# Patient Record
Sex: Male | Born: 1960
Health system: Southern US, Community
[De-identification: ages and names within clinical notes are randomized; demographics above are authoritative.]

## PROBLEM LIST (undated history)

## (undated) DIAGNOSIS — J449 Chronic obstructive pulmonary disease, unspecified: Secondary | ICD-10-CM

## (undated) DIAGNOSIS — M109 Gout, unspecified: Secondary | ICD-10-CM

## (undated) DIAGNOSIS — E785 Hyperlipidemia, unspecified: Secondary | ICD-10-CM

## (undated) DIAGNOSIS — I10 Essential (primary) hypertension: Secondary | ICD-10-CM

## (undated) DIAGNOSIS — K219 Gastro-esophageal reflux disease without esophagitis: Secondary | ICD-10-CM

## (undated) HISTORY — DX: Chronic obstructive pulmonary disease, unspecified: J44.9

## (undated) HISTORY — DX: Essential (primary) hypertension: I10

## (undated) HISTORY — DX: Gastro-esophageal reflux disease without esophagitis: K21.9

## (undated) HISTORY — DX: Hyperlipidemia, unspecified: E78.5

## (undated) HISTORY — PX: UPPER GASTROINTESTINAL ENDOSCOPY: SHX188

## (undated) HISTORY — DX: Gout, unspecified: M10.9

## (undated) HISTORY — PX: TONSILLECTOMY: SUR1361

---

## 2004-08-07 ENCOUNTER — Ambulatory Visit: Payer: Self-pay | Admitting: Cardiology

## 2005-11-01 ENCOUNTER — Emergency Department (HOSPITAL_COMMUNITY): Admission: EM | Admit: 2005-11-01 | Discharge: 2005-11-01 | Payer: Self-pay | Admitting: Family Medicine

## 2010-02-06 ENCOUNTER — Encounter: Payer: Self-pay | Admitting: Internal Medicine

## 2010-02-09 ENCOUNTER — Encounter (INDEPENDENT_AMBULATORY_CARE_PROVIDER_SITE_OTHER): Payer: Self-pay | Admitting: *Deleted

## 2010-02-09 DIAGNOSIS — I10 Essential (primary) hypertension: Secondary | ICD-10-CM | POA: Insufficient documentation

## 2010-02-09 DIAGNOSIS — R1013 Epigastric pain: Secondary | ICD-10-CM | POA: Insufficient documentation

## 2010-02-09 DIAGNOSIS — E785 Hyperlipidemia, unspecified: Secondary | ICD-10-CM | POA: Insufficient documentation

## 2010-02-09 DIAGNOSIS — J45909 Unspecified asthma, uncomplicated: Secondary | ICD-10-CM | POA: Insufficient documentation

## 2010-02-13 ENCOUNTER — Ambulatory Visit: Payer: Self-pay | Admitting: Internal Medicine

## 2010-02-13 DIAGNOSIS — J4489 Other specified chronic obstructive pulmonary disease: Secondary | ICD-10-CM | POA: Insufficient documentation

## 2010-02-13 DIAGNOSIS — J449 Chronic obstructive pulmonary disease, unspecified: Secondary | ICD-10-CM | POA: Insufficient documentation

## 2010-02-22 ENCOUNTER — Ambulatory Visit: Payer: Self-pay | Admitting: Internal Medicine

## 2010-02-24 ENCOUNTER — Encounter: Payer: Self-pay | Admitting: Internal Medicine

## 2010-06-13 ENCOUNTER — Encounter: Admission: RE | Admit: 2010-06-13 | Discharge: 2010-06-13 | Payer: Self-pay | Admitting: Occupational Medicine

## 2010-09-25 NOTE — Letter (Signed)
Summary: EGD Instructions  [facility]  [address] [place], [place] [address]   Phone: [phone]  Fax: [phone]       Austin Mcbride    July 07, 1961    MRN: [id number]       Procedure Day /Date: Thursday 02/22/10     Arrival Time: 7:30 am     Procedure Time: 8:00 am     Location of Procedure:                    _ x _ [facility] (4th Floor)  PREPARATION FOR ENDOSCOPY   On 02/22/10 THE DAY OF THE PROCEDURE:  1.   No solid foods, milk or milk products are allowed after midnight the night before your procedure.  2.   Do not drink anything colored red or purple.  Avoid juices with pulp.  No orange juice.  3.  You may drink clear liquids until 6:00 am, which is 2 hours before your procedure.                                                                                                CLEAR LIQUIDS INCLUDE: Water Jello Ice Popsicles Tea (sugar ok, no milk/cream) Powdered fruit flavored drinks Coffee (sugar ok, no milk/cream) Gatorade Juice: apple, white grape, white cranberry  Lemonade Clear bullion, consomm, broth Carbonated beverages (any kind) Strained chicken noodle soup Hard Candy   MEDICATION INSTRUCTIONS  Unless otherwise instructed, you should take regular prescription medications with a small sip of water as early as possible the morning of your procedure.                   OTHER INSTRUCTIONS  You will need a responsible adult at least 51 years of age to accompany you and drive you home.   This person must remain in the waiting room during your procedure.  Wear loose fitting clothing that is easily removed.  Leave jewelry and other valuables at home.  However, you may wish to bring a book to read or an iPod/MP3 player to listen to music as you wait for your procedure to start.  Remove all body piercing jewelry and leave at home.  Total time from sign-in until discharge is approximately 2-3 hours.  You should go  home directly after your procedure and rest.  You can resume normal activities the day after your procedure.  The day of your procedure you should not:   Drive   Make legal decisions   Operate machinery   Drink alcohol   Return to work  You will receive specific instructions about eating, activities and medications before you leave.    The above instructions have been reviewed and explained to me by   Lamona Curl CMA Duncan Dull)  February 13, 2010 9:26 AM     I fully understand and can verbalize these instructions _____________________________ Date 02/13/10

## 2010-09-25 NOTE — Letter (Signed)
Summary: Patient [facility] Biopsy Results  [facility]  [address] [place], [place] [address]   Phone: [phone]  Fax: [phone]        February 24, 2010 MRN: [id number]    Austin Mcbride [address] [place], [place]  [address]    Dear Mr. Frenette,  I am pleased to inform you that the biopsies taken during your recent endoscopic examination did not show any evidence of cancer upon pathologic examination.  Additional information/recommendations:  __No further action is needed at this time.  Please follow-up with      your primary care physician for your other healthcare needs.  __ Please call [phone] to schedule a return visit to review      your condition.  _x_ Continue with the treatment plan as outlined on the day of your      exam.  __ You should have a repeat endoscopic examination for this problem              in _ months/years.   Please call us if you are having persistent problems or have questions about your condition that have not been fully answered at this time.  Sincerely,  Hart Carwin MD  This letter has been electronically signed by your physician.  Appended Document: Patient Notice-Endo Biopsy Results letter mailed 7.6.11

## 2010-09-25 NOTE — Miscellaneous (Signed)
Summary: gi medication  Clinical Lists Changes  Medications: Added new medication of PRILOSEC 20 MG  CPDR (OMEPRAZOLE) 1 twice a day 30 minutes before meals - Signed Rx of PRILOSEC 20 MG  CPDR (OMEPRAZOLE) 1 twice a day 30 minutes before meals;  #180 x 3;  Signed;  Entered by: Eual Fines RN;  Authorized by: Hart Carwin MD;  Method used: Electronically to [facility] [address], [place], [place], [place]  [address], Ph: [phone], Fax: [phone] Observations: Added new observation of NKA: T (02/22/2010 8:42)    Prescriptions: PRILOSEC 20 MG  CPDR (OMEPRAZOLE) 1 twice a day 30 minutes before meals  #180 x 3   Entered by:   Eual Fines RN   Authorized by:   Hart Carwin MD   Signed by:   Eual Fines RN on 02/22/2010   Method used:   Electronically to        [facility]  [address] (retail)       [address]       [place], [place]  [address]       Ph: [phone]       Fax: [phone]   RxID:   [id number]

## 2010-09-25 NOTE — Letter (Signed)
Summary: New Patient letter  [facility]  [address] [place], [place] [address]   Phone: [phone]  Fax: [phone]       02/09/2010 MRN: [id number]  Austin Mcbride [address] [place], [place]  [address]  Dear Austin Mcbride,  Welcome to the Gastroenterology Division at [facility].    You are scheduled to see Dr.  Juanda Chance on 02-13-10 at 9:15AM on the 3rd floor at [facility], [address]  We ask that you try to arrive at our office 15 minutes prior to your appointment time to allow for check-in.  We would like you to complete the enclosed self-administered evaluation form prior to your visit and bring it with you on the day of your appointment.  We will review it with you.  Also, please bring a complete list of all your medications or, if you prefer, bring the medication bottles and we will list them.  Please bring your insurance card so that we may make a copy of it.  If your insurance requires a referral to see a specialist, please bring your referral form from your primary care physician.  Co-payments are due at the time of your visit and may be paid by cash, check or credit card.     Your office visit will consist of a consult with your physician (includes a physical exam), any laboratory testing he/she may order, scheduling of any necessary diagnostic testing (e.g. x-ray, ultrasound, CT-scan), and scheduling of a procedure (e.g. Endoscopy, Colonoscopy) if required.  Please allow enough time on your schedule to allow for any/all of these possibilities.    If you cannot keep your appointment, please call [phone] to cancel or reschedule prior to your appointment date.  This allows [place] the opportunity to schedule an appointment for another patient in need of care.  If you do not cancel or reschedule by 5 p.m. the business day prior to your appointment date, you will be charged a $50.00 late cancellation/no-show fee.    Thank you for choosing  [facility] for your medical needs.  We appreciate the opportunity to care for you.  Please visit [place] at our website  to learn more about our practice.                     Sincerely,                                                             The Gastroenterology Division

## 2010-09-25 NOTE — Assessment & Plan Note (Signed)
Summary: EPIGASTRIC PAIN/YF   History of Present Illness Visit Type: consult Primary GI MD: Lina Sar MD Primary Provider: Gilmore Laroche, MD Requesting Provider: Gilmore Laroche, MD Chief Complaint: Epigastric pain, pt states it is worse when he is sitting on the lawn mower at work.  Pt was started on omeprazole that helps somewhat. History of Present Illness:   Austin Mcbride is a 50 year old white male who comes at the recommendation of Dr Gilmore Laroche for further evaluation of epigastric pain. 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 The sensation is described as a burning pain. Patient denies any other symptoms. He used to take Pepcid 40 mg but now takes omeprazole 20 mg which seems to have helped somewhat. He denies any dysphagia and his weight has been stable. He takes ibuprofen 3 tablets about once per week. Patient does smoke and admits to having about 4 cups of caffeine daily. He has never had an endoscopy or colonoscopy.    GI Review of Systems    Reports abdominal pain.     Location of  Abdominal pain: epigastric area.    Denies acid reflux, belching, bloating, chest pain, dysphagia with liquids, dysphagia with solids, heartburn, loss of appetite, nausea, vomiting, vomiting blood, weight loss, and  weight gain.        Denies anal fissure, black tarry stools, change in bowel habit, constipation, diarrhea, diverticulosis, fecal incontinence, heme positive stool, hemorrhoids, irritable bowel syndrome, jaundice, light color stool, liver problems, rectal bleeding, and  rectal pain. Preventive Screening-Counseling & Management  Alcohol-Tobacco     Smoking Status: current      Drug Use:  no.      Current Medications (verified): 1)  Zocor 40 Mg Tabs (Simvastatin) .... Take 1 Tablet By Mouth Once A Day 2)  Hydrochlorothiazide 25 Mg Tabs (Hydrochlorothiazide) .... Take 1 Tablet By Mouth Once A Day 3)  Omeprazole 20 Mg Cpdr (Omeprazole) .... Take 1  Tablet By Mouth Once A Day 4)  Advair Diskus 250-50 Mcg/dose Aepb (Fluticasone-Salmeterol) .... Take 1 Puff Two Times A Day 5)  Proair Hfa 108 (90 Base) Mcg/act Aers (Albuterol Sulfate) .... Take As Needed 6)  Aspirin 81 Mg Tabs (Aspirin) .... Take 1 Tablet By Mouth Once A Day 7)  Benadryl 25 Mg Caps (Diphenhydramine Hcl) .... 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  5 cig/day Alcohol Use - yes 2/day Daily Caffeine Use 4/day Illicit Drug Use - no Smoking Status:  current Drug Use:  no  Review of Systems       The patient complains of allergy/sinus, hearing problems, and sleeping problems.  The patient denies anemia, anxiety-new, arthritis/joint pain, back pain, blood in urine, breast changes/lumps, confusion, cough, coughing up blood, depression-new, fainting, fatigue, fever, headaches-new, heart murmur, heart rhythm changes, itching, muscle pains/cramps, night sweats, nosebleeds, shortness of breath, skin rash, sore throat, swelling of feet/legs, swollen lymph glands, thirst - excessive, urination - excessive, urination changes/pain, urine leakage, vision changes, and voice change.         Pertinent positive and negative review of systems were noted in the above HPI. All other ROS was otherwise  negative.   Vital Signs:  Patient profile:   50 year old male Height:      66 inches Weight:      196 pounds BMI:     31.75 BSA:     1.98 Pulse rate:   84 / minute Pulse rhythm:   regular BP sitting:   136 / 90  (left arm) Cuff size:   regular  Vitals Entered By: Francee Piccolo CMA Duncan Dull) (February 13, 2010 8:53 AM)  Physical Exam  General:   Well developed, well nourished, no acute distress. Eyes:  PERRLA, no icterus. Mouth:  No deformity or lesions, dentition normal. Neck:  Supple; no masses or thyromegaly. Chest Wall:  no tenderness at the costochondral junctions.  Lungs:  Clear throughout to auscultation. Heart:  Regular rate and rhythm; no murmurs, rubs,  or bruits. Abdomen:  minimal tenderness to the left of the epigastrium along the left costal margin. No pulsations and no palpable mass. Bowel sounds are normoactive. The right upper and lower quadrants unremarkable. No bruit. Extremities:  No clubbing, cyanosis, edema or deformities noted. Skin:  Intact without significant lesions or rashes. Psych:  Alert and cooperative. Normal mood and affect.   Impression & Recommendations:  Problem # 1:  ABDOMINAL PAIN, EPIGASTRIC (ICD-789.06)  Patient has epigastric and left costal margin abdominal discomfort described as a burning sensation. This is most likely all related to esophageal reflux although we do need to rule our a hiatal hernia, gastritis, or H. pylori. He is somewhat improved on Prilosec. Ibuprofen and smoking are the contributing factors. I will increase his Prilosec to 40 mg daily and scheduled him for an upper endoscopy with appropriate biopsies. I have asked him to reduce his ibuprofen intake and smoking. Further evaluation such as a CT scan of the abdomen will depend on the findings of the endoscopy.  Orders: EGD (EGD)  Problem # 2:  SPECIAL SCREENING FOR MALIGNANT NEOPLASMS COLON (ICD-V76.51) Patient will be due for a screening colonoscopy after he turns 49 in December of this year.  Patient Instructions: 1)  You have been scheduled for an endoscopy on 02/22/10 at [facility] floor. You will need to arrive at 7:30 am for your 8:00 am procedure. 2)  Please increase your prilosec to 20 mg two times a day. We have provided you with samples. 3)  Screening colonoscopy at age 34 after December 2011. 4)  Copy sent  to : Dr Gilmore Laroche 5)  The medication list was reviewed and reconciled.  All changed / newly prescribed medications were explained.  A complete medication list was provided to the patient / caregiver.

## 2010-09-25 NOTE — Procedures (Signed)
Summary: Upper Endoscopy  Patient: Austin Mcbride Note: All result statuses are Final unless otherwise noted.  Tests: (1) Upper Endoscopy (EGD)   EGD Upper Endoscopy       DONE     [facility]     [address]     [place], [place]  [address]           ENDOSCOPY PROCEDURE REPORT           PATIENT:  [name], [name]  MR#:  [id number]     BIRTHDATE:  1961-06-08, 48 yrs. old  GENDER:  male           ENDOSCOPIST:  Hedwig Morton. Juanda Chance, MD     Referred by:  Lynnea Ferrier, M.D.           PROCEDURE DATE:  02/22/2010     PROCEDURE:  EGD with biopsy     ASA CLASS:  Class I     INDICATIONS:  chest pain, abdominal pain burning pain patially     relieved with PPI, takes Ibuprofen, 4 cups of coffee/day.           MEDICATIONS:   Versed 8 mg, Fentanyl 75 mcg     TOPICAL ANESTHETIC:  Exactacain Spray           DESCRIPTION OF PROCEDURE:   After the risks benefits and     alternatives of the procedure were thoroughly explained, informed     consent was obtained.  The LB GIF-H180 [id number] endoscope was     introduced through the mouth and advanced to the second portion of     the duodenum, without limitations.  The instrument was slowly     withdrawn as the mucosa was fully examined.     <<PROCEDUREIMAGES>>           irregular Z-line. no hiatal henia, 2 short tongues of gastric     mucosa With standard forceps, a biopsy was obtained and sent to     pathology (see image4, image5, and image6).  Mild gastritis was     found. With standard forceps, a biopsy was obtained and sent to     pathology (see image2). few red streaks antrum  Otherwise the     examination was normal (see image1 and image3).    Retroflexed     views revealed no abnormalities.    The scope was then withdrawn     from the patient and the procedure completed.           COMPLICATIONS:  None           ENDOSCOPIC IMPRESSION:     1) Irregular Z-line     2) Mild gastritis     3) Otherwise normal examination     s/p biopsies   RECOMMENDATIONS:     1) Await biopsy results     continue Prelosec 40mg /day in divided doses           REPEAT EXAM:  In 0 year(s) for.           ______________________________     Hedwig Morton. Juanda Chance, MD           CC:           n.     eSIGNED:   Hedwig Morton. Oriel Rumbold at 02/22/2010 08:31 AM           Forest Becker, [id number]  Note: An exclamation mark (!) indicates a result that was not dispersed into the flowsheet. Document  Creation Date: 02/22/2010 8:32 AM _______________________________________________________________________  (1) Order result status: Final Collection or observation date-time: 02/22/2010 08:22 Requested date-time:  Receipt date-time:  Reported date-time:  Referring Physician:   Ordering Physician: Lina Sar [phone]) Specimen Source:  Source: Launa Grill Order Number: [id number] Lab site:

## 2010-11-09 ENCOUNTER — Encounter: Payer: Self-pay | Admitting: *Deleted

## 2010-11-09 ENCOUNTER — Encounter: Payer: Self-pay | Admitting: Internal Medicine

## 2010-11-13 NOTE — Miscellaneous (Signed)
Summary: [facility]  Clinical Lists Changes  Medications: Added new medication of MOVIPREP 100 GM  SOLR (PEG-KCL-NACL-NASULF-NA ASC-C) As per prep instructions. - Signed Rx of MOVIPREP 100 GM  SOLR (PEG-KCL-NACL-NASULF-NA ASC-C) As per prep instructions.;  #1 x 0;  Signed;  Entered by: Ezra Sites RN;  Authorized by: Hart Carwin MD;  Method used: Electronically to [facility] [address], [place], [place], [place]  [address], Ph: [phone], Fax: [phone] Observations: Added new observation of NKA: T (11/09/2010 8:08)    Prescriptions: MOVIPREP 100 GM  SOLR (PEG-KCL-NACL-NASULF-NA ASC-C) As per prep instructions.  #1 x 0   Entered by:   Ezra Sites RN   Authorized by:   Hart Carwin MD   Signed by:   Ezra Sites RN on 11/09/2010   Method used:   Electronically to        [facility]  [address] (retail)       [address]       [place], [place]  [address]       Ph: [phone]       Fax: [phone]   RxID:   [id number]

## 2010-11-13 NOTE — Letter (Signed)
Summary: [facility] Instructions  [facility]  [address] [place], [place] [address]   Phone: [phone]  Fax: [phone]       COY ROCHFORD    1960/08/30    MRN: [id number]        Procedure Day /Date:  Thursday 11/22/2010     Arrival Time: 1:00 pm     Procedure Time: 2:00 pm     Location of Procedure:                    _ x_   Endoscopy Center (4th Floor)                        PREPARATION FOR COLONOSCOPY WITH MOVIPREP   Starting 5 days prior to your procedure Saturday 3/24 do not eat nuts, seeds, popcorn, corn, beans, peas,  salads, or any raw vegetables.  Do not take any fiber supplements (e.g. Metamucil, Citrucel, and Benefiber).  THE DAY BEFORE YOUR PROCEDURE         DATE: Wednesday 3/28 1.  Drink clear liquids the entire day-NO SOLID FOOD  2.  Do not drink anything colored red or purple.  Avoid juices with pulp.  No orange juice.  3.  Drink at least 64 oz. (8 glasses) of fluid/clear liquids during the day to prevent dehydration and help the prep work efficiently.  CLEAR LIQUIDS INCLUDE: Water Jello Ice Popsicles Tea (sugar ok, no milk/cream) Powdered fruit flavored drinks Coffee (sugar ok, no milk/cream) Gatorade Juice: apple, white grape, white cranberry  Lemonade Clear bullion, consomm, broth Carbonated beverages (any kind) Strained chicken noodle soup Hard Candy                             4.  In the morning, mix first dose of MoviPrep solution:    Empty 1 Pouch A and 1 Pouch B into the disposable container    Add lukewarm drinking water to the top line of the container. Mix to dissolve    Refrigerate (mixed solution should be used within 24 hrs)  5.  Begin drinking the prep at 5:00 p.m. The MoviPrep container is divided by 4 marks.   Every 15 minutes drink the solution down to the next mark (approximately 8 oz) until the full liter is complete.   6.  Follow completed prep with 16 oz of clear liquid of your choice (Nothing  red or purple).  Continue to drink clear liquids until bedtime.  7.  Before going to bed, mix second dose of MoviPrep solution:    Empty 1 Pouch A and 1 Pouch B into the disposable container    Add lukewarm drinking water to the top line of the container. Mix to dissolve    Refrigerate  THE DAY OF YOUR PROCEDURE      DATE: Thursday 3/29  Beginning at 9:00 a.m. (5 hours before procedure):         1. Every 15 minutes, drink the solution down to the next mark (approx 8 oz) until the full liter is complete.  2. Follow completed prep with 16 oz. of clear liquid of your choice.    3. You may drink clear liquids until 12:00 pm (2 HOURS BEFORE PROCEDURE).   MEDICATION INSTRUCTIONS  Unless otherwise instructed, you should take regular prescription medications with a small sip of water   as early as possible the morning of your procedure.  Additional medication instructions: Do not take HCTZ day of procedure.         OTHER INSTRUCTIONS  You will need a responsible adult at least 51 years of age to accompany you and drive you home.   This person must remain in the waiting room during your procedure.  Wear loose fitting clothing that is easily removed.  Leave jewelry and other valuables at home.  However, you may wish to bring a book to read or  an iPod/MP3 player to listen to music as you wait for your procedure to start.  Remove all body piercing jewelry and leave at home.  Total time from sign-in until discharge is approximately 2-3 hours.  You should go home directly after your procedure and rest.  You can resume normal activities the  day after your procedure.  The day of your procedure you should not:   Drive   Make legal decisions   Operate machinery   Drink alcohol   Return to work  You will receive specific instructions about eating, activities and medications before you leave.    The above instructions have been reviewed and explained to me by   Ezra Sites RN  November 09, 2010 9:11 AM    I fully understand and can verbalize these instructions _____________________________ Date _________

## 2010-11-21 ENCOUNTER — Encounter: Payer: Self-pay | Admitting: Internal Medicine

## 2010-11-22 ENCOUNTER — Ambulatory Visit (AMBULATORY_SURGERY_CENTER): Payer: 59 | Admitting: Internal Medicine

## 2010-11-22 ENCOUNTER — Encounter: Payer: Self-pay | Admitting: Internal Medicine

## 2010-11-22 VITALS — BP 139/88 | HR 81 | Temp 97.8°F | Resp 15 | Ht 66.0 in | Wt 195.0 lb

## 2010-11-22 DIAGNOSIS — Z1211 Encounter for screening for malignant neoplasm of colon: Secondary | ICD-10-CM

## 2010-11-22 DIAGNOSIS — K5730 Diverticulosis of large intestine without perforation or abscess without bleeding: Secondary | ICD-10-CM

## 2010-11-22 NOTE — Patient Instructions (Signed)
Please review Patient Discharge Instructions and Patient teaching(green sheet). Findings per Dr.Brodie was diverticulosis, review the education sheet and High fiber diet handout. Repeat colonoscopy in 10 years. Resume your daily medications. Resume routine medical care with your primary physician.

## 2010-11-22 NOTE — Progress Notes (Signed)
Pressure applied to abdomen to reach cecum.  

## 2010-11-23 ENCOUNTER — Telehealth: Payer: Self-pay | Admitting: *Deleted

## 2010-11-23 NOTE — Telephone Encounter (Signed)

## 2010-11-27 NOTE — Procedures (Signed)
Summary: Colonoscopy  Patient: [name] Note: All result statuses are Final unless otherwise noted.  Tests: (1) Colonoscopy (COL)   COL Colonoscopy           DONE     [facility]     [address]     [place], [place]  [address]          COLONOSCOPY PROCEDURE REPORT          PATIENT:  Austin Mcbride, Austin Mcbride  MR#:  [id number]     BIRTHDATE:  01-06-1961, 49 yrs. old  GENDER:  male     ENDOSCOPIST:  Hedwig Morton. Juanda Chance, MD     REF. BY:     PROCEDURE DATE:  11/22/2010     PROCEDURE:  Colonoscopy 19147     ASA CLASS:  Class I     INDICATIONS:  colorectal cancer screening, average risk     MEDICATIONS:   Versed 8 mg, Fentanyl 75 mcg          DESCRIPTION OF PROCEDURE:   After the risks benefits and     alternatives of the procedure were thoroughly explained, informed     consent was obtained.  Digital rectal exam was performed and     revealed no rectal masses.   The LB 180AL [id number] endoscope was     introduced through the anus and advanced to the cecum, which was     identified by both the appendix and ileocecal valve, without     limitations.  The quality of the prep was good, using MoviPrep.     The instrument was then slowly withdrawn as the colon was fully     examined.     <<PROCEDUREIMAGES>>          FINDINGS:  Mild diverticulosis was found in the sigmoid colon (see     image1, image2, image6, and image5).  This was otherwise a normal     examination of the colon (see image3, image4, and image7).     Retroflexed views in the rectum revealed no abnormalities.    The     scope was then withdrawn from the patient and the procedure     completed.          COMPLICATIONS:  None     ENDOSCOPIC IMPRESSION:     1) Mild diverticulosis in the sigmoid colon     2) Otherwise normal examination     RECOMMENDATIONS:     1) high fiber diet     REPEAT EXAM:  In 10 year(s) for.          ______________________________     Hedwig Morton. Juanda Chance, MD          CC:          n.  eSIGNED:   Hedwig Morton. Jamil Armwood at 11/22/2010 02:54 PM          Forest Becker, [phone]  Note: An exclamation mark (!) indicates a result that was not dispersed into the flowsheet. Document Creation Date: 11/22/2010 2:54 PM _______________________________________________________________________  (1) Order result status: Final Collection or observation date-time: 11/22/2010 14:46 Requested date-time:  Receipt date-time:  Reported date-time:  Referring Physician:   Ordering Physician: Lina Sar [id number]) Specimen Source:  Source: Launa Grill Order Number: [id number] Lab site:

## 2011-02-26 ENCOUNTER — Ambulatory Visit
Admission: RE | Admit: 2011-02-26 | Discharge: 2011-02-26 | Disposition: A | Discharge status: Home / Self Care | Payer: 59 | Source: Ambulatory Visit | Attending: Family Medicine | Admitting: Family Medicine

## 2011-02-26 ENCOUNTER — Other Ambulatory Visit: Payer: Self-pay | Admitting: Family Medicine

## 2011-02-26 DIAGNOSIS — R52 Pain, unspecified: Secondary | ICD-10-CM

## 2011-02-26 DIAGNOSIS — M25473 Effusion, unspecified ankle: Secondary | ICD-10-CM

## 2011-03-29 ENCOUNTER — Ambulatory Visit (HOSPITAL_COMMUNITY)
Admission: RE | Admit: 2011-03-29 | Discharge: 2011-03-29 | Disposition: A | Discharge status: Home / Self Care | Payer: 59 | Source: Ambulatory Visit | Attending: Family Medicine | Admitting: Family Medicine

## 2011-03-29 ENCOUNTER — Other Ambulatory Visit: Payer: Self-pay | Admitting: Family Medicine

## 2011-03-29 DIAGNOSIS — R220 Localized swelling, mass and lump, head: Secondary | ICD-10-CM | POA: Insufficient documentation

## 2011-03-29 DIAGNOSIS — R599 Enlarged lymph nodes, unspecified: Secondary | ICD-10-CM | POA: Insufficient documentation

## 2011-03-29 DIAGNOSIS — R221 Localized swelling, mass and lump, neck: Secondary | ICD-10-CM

## 2011-03-29 MED ORDER — IOHEXOL 300 MG/ML  SOLN
75.0000 mL | Freq: Once | INTRAMUSCULAR | Status: AC | PRN
  Administered 2011-03-29: 75 mL via INTRAVENOUS

## 2012-02-29 IMAGING — CT CT NECK W/ CM
5 of 6 series · 15 of 33 positions shown, 17 images · IV contrast (Omnipaque 300)
Comparison: None.

CLINICAL DATA: Right-sided neck mass.

CT NECK WITH CONTRAST
TECHNIQUE: Multidetector CT imaging of the neck was performed with
intravenous contrast.
Contrast: 75 ml Omnipaque 300

[Series 2: soft tissue neck 2.0 b31s · axial · 0.48mm/px · z∈[+97,+175]mm · 2 of 117 slices shown]
[im 39/117  soft-tissue]
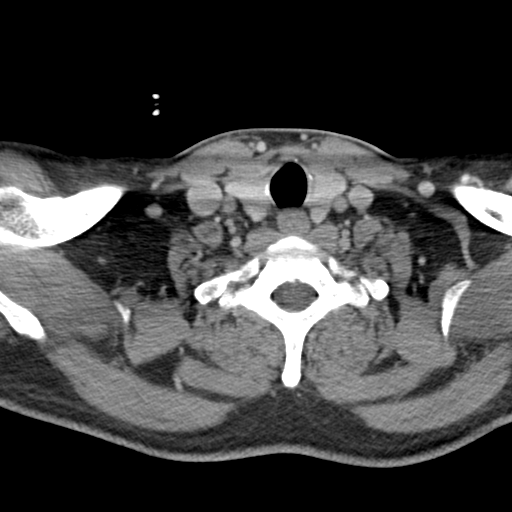
[im 78/117  soft-tissue]
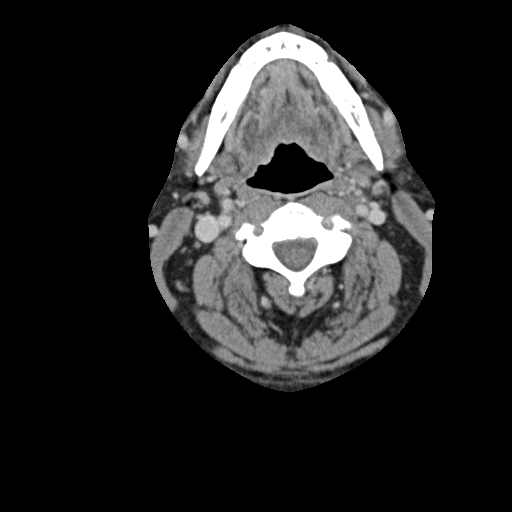

[Series 4: neck 2.0 soft tissue sag · sagittal · 0.44mm/px · 5 of 123 slices shown, 6 images]
[im 41/123  bone]
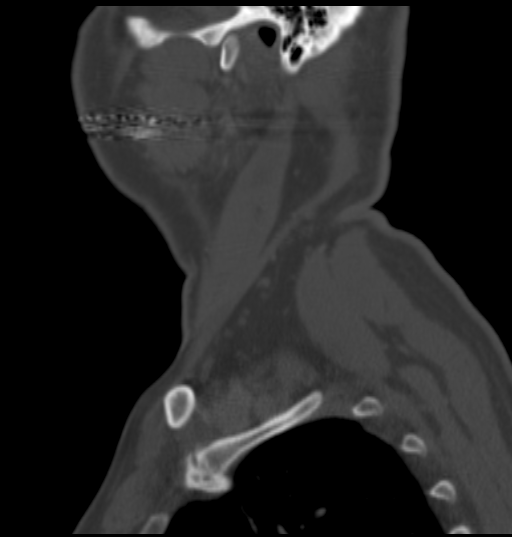
[im 51/123  bone]
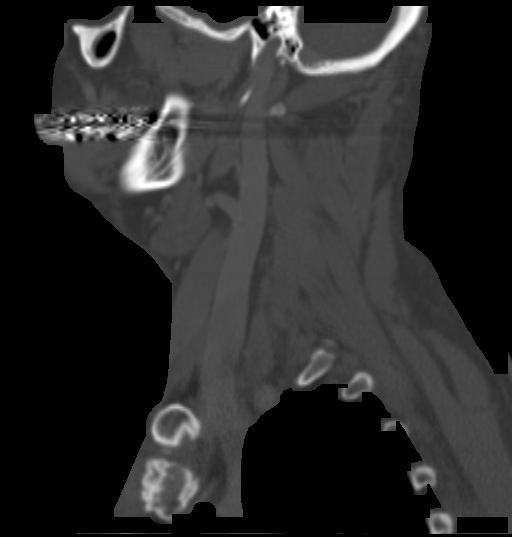
[im 62/123  soft-tissue]
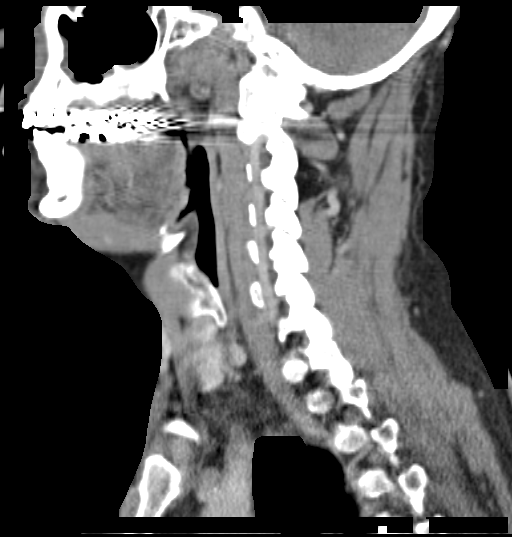
[im 62/123  bone]
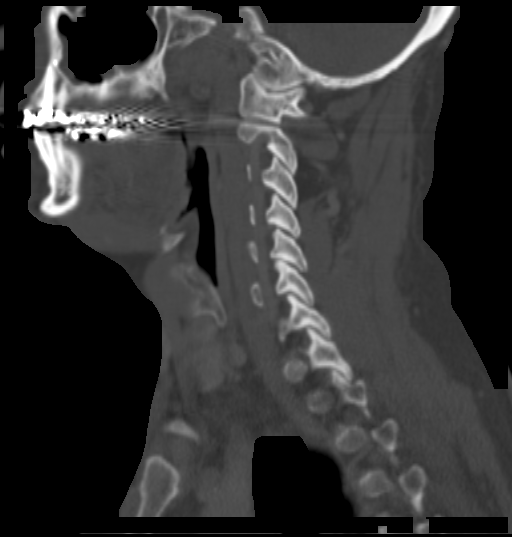
[im 72/123  bone]
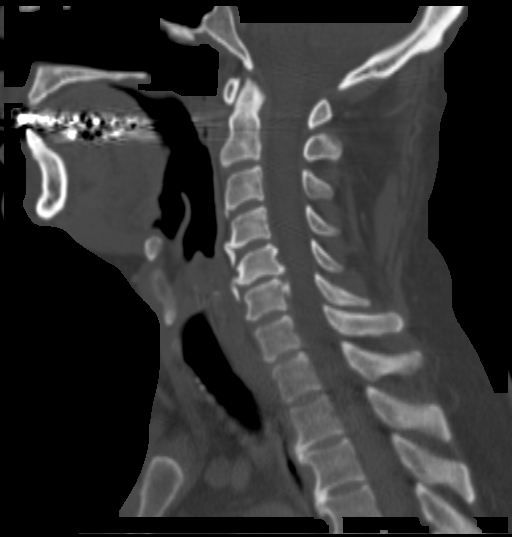
[im 82/123  bone]
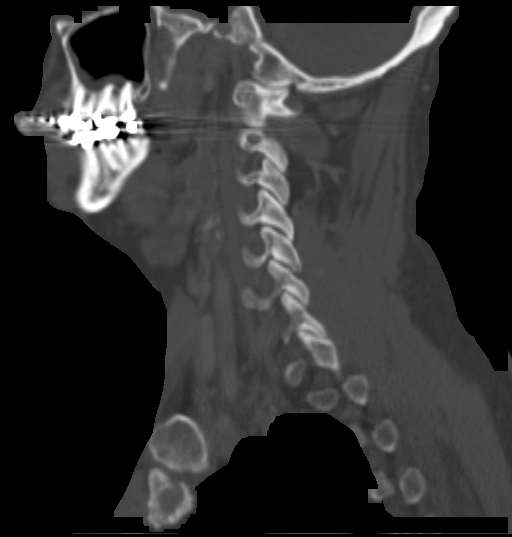

[Series 5: neck 2.0 soft tissue coro · coronal · 0.45mm/px · 2 of 115 slices shown]
[im 39/115  bone]
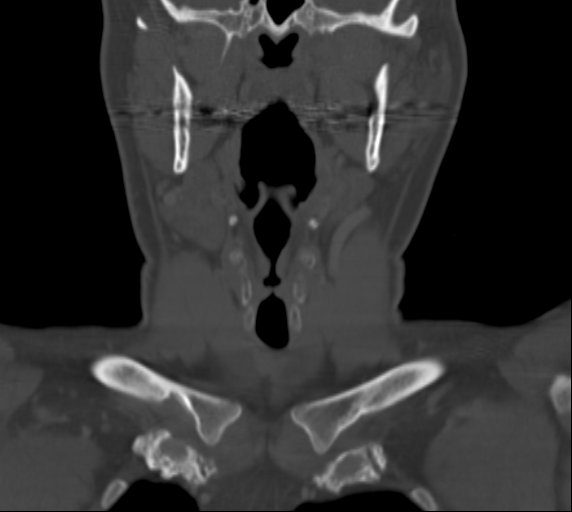
[im 77/115  bone]
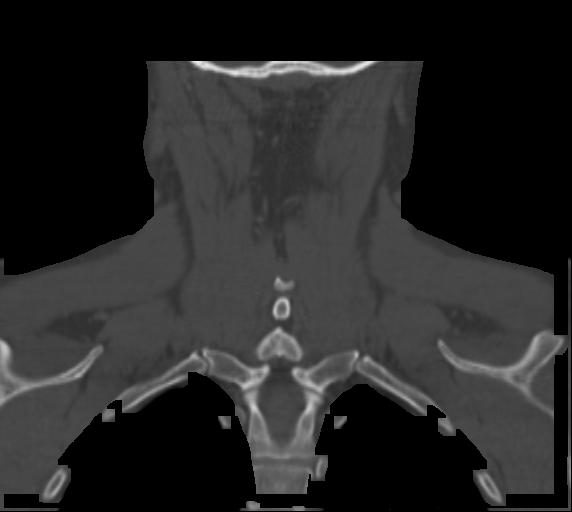

[Series 6: axial soft tissue neck 2.0 · axial · 0.41mm/px · z∈[+78,+192]mm · 3 of 115 slices shown, 4 images (1 of 2)]
[im 29/115  soft-tissue]
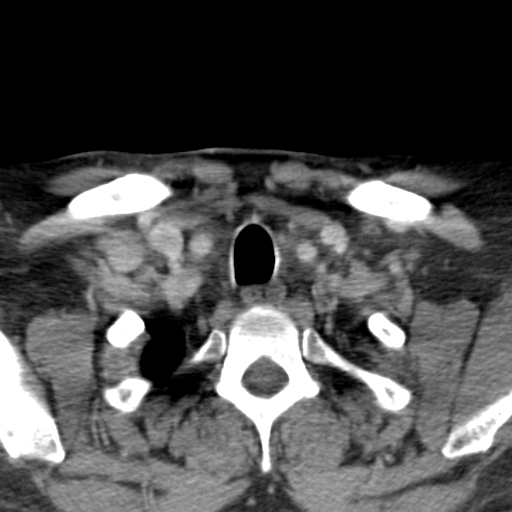
[im 29/115  bone]
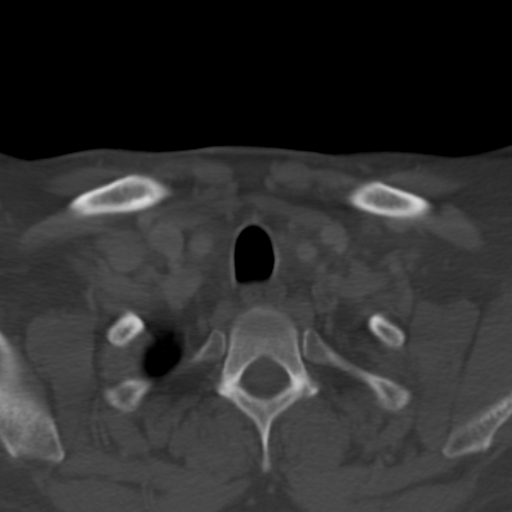
[im 58/115  bone]
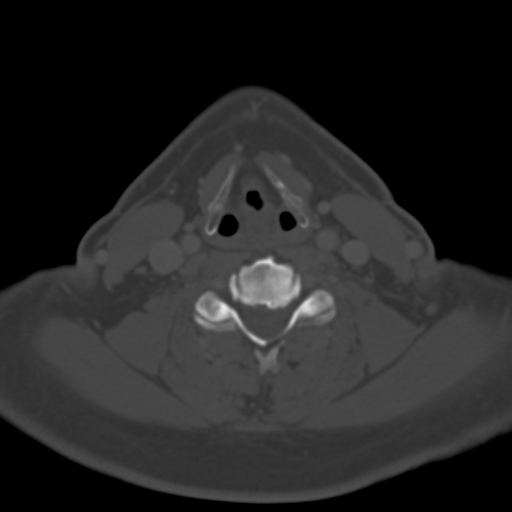
[im 86/115  bone]
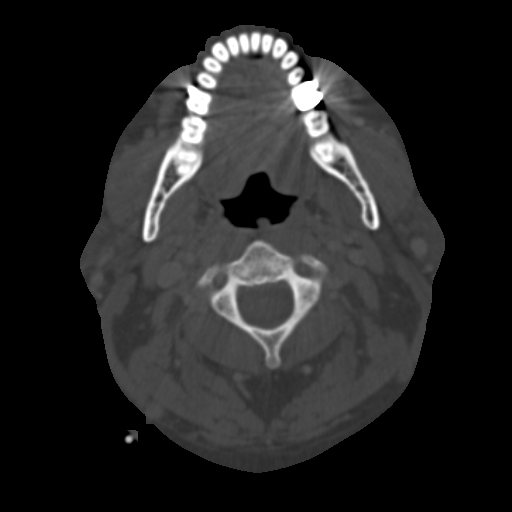

[Series 8: axial soft tissue neck 2.0 · axial · 0.41mm/px · z∈[+54,+164]mm · 3 of 116 slices shown (2 of 2)]
[im 29/116  soft-tissue]
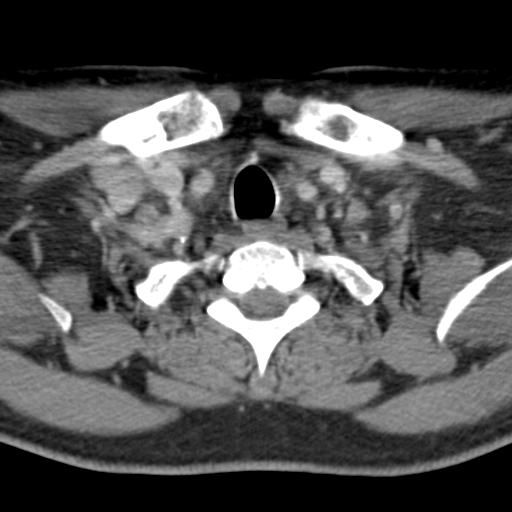
[im 58/116  soft-tissue]
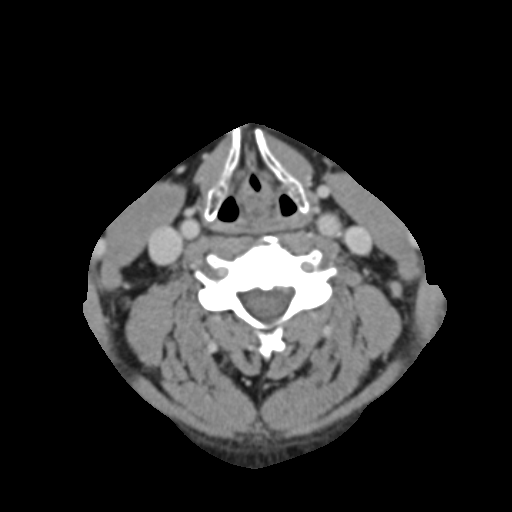
[im 87/116  soft-tissue]
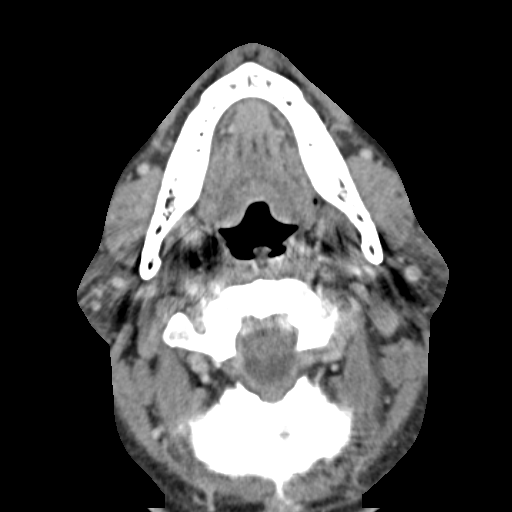

[15 of 33 positions shown; findings below may reference images not displayed]

FINDINGS: The area marked is in the posterolateral right neck.  A
10 x 6 mm focal nodule lies subjacent to the area marked in the
posterior lateral right neck.  There is some inflammatory change
around this.  This likely represents an inflammatory lymph node.
Other similar sub centimeter lymph nodes are present in the
posterior level II space bilaterally.  Ovoid level IB lymph nodes
measure 13 mm on the right and 15 mm on the left.  The fatty hilum
is well preserved in both nodes.  No pathologically enlarged lymph
nodes are present.

No focal mucosal or submucosal lesions are present.  The thyroid
gland is within normal limits.  The upper mediastinum is
unremarkable.

The lung windows are unremarkable.

Bone windows demonstrate to posterior ridging versus ossification
of posterior longitudinal ligament at C5-6.  This results in mild
central canal narrowing.
IMPRESSION: 1.  Subjacent to the area marked in the posterolateral right neck
is an inflammatory nodule which likely represents a lymph node.
2.  Other benign-appearing posterior level II and level I lymph
nodes are seen bilaterally.
3.  No pathologic nodes or evidence for lymphoma.
4.  Mild focal spondylosis at C4-5 and to a greater extent at C5-6
as described.

## 2013-03-24 ENCOUNTER — Telehealth: Payer: Self-pay | Admitting: Family Medicine

## 2013-03-24 NOTE — Telephone Encounter (Signed)
Patient aware.

## 2013-03-24 NOTE — Telephone Encounter (Signed)
Take 1 tablet twice a day for 1 week.

## 2013-03-26 ENCOUNTER — Telehealth: Payer: Self-pay | Admitting: Family Medicine

## 2013-03-26 ENCOUNTER — Other Ambulatory Visit: Payer: Self-pay | Admitting: Family Medicine

## 2013-03-26 NOTE — Telephone Encounter (Signed)
Simvastatin 40 mg 1 QHS Lisinopril 20 mg 1 QD Omeprazole 40 mg 1 QD Allopurinol 200 mg 1 QD Advair inhaler 250/50 1 QAM  Pt is under the impression that his medication was refilled for a year, but it he says it has only been six months. Does he need to come in or can you refill for another six months?  According to his chart, he last saw Dr. Tanya Nones 03/06/12 for a medication check even though he says he has been seen since then.

## 2013-03-27 ENCOUNTER — Other Ambulatory Visit: Payer: Self-pay | Admitting: Family Medicine

## 2013-03-30 NOTE — Telephone Encounter (Signed)
These were called out 03/26/13

## 2013-05-29 ENCOUNTER — Other Ambulatory Visit: Payer: Self-pay | Admitting: Family Medicine

## 2013-08-27 ENCOUNTER — Other Ambulatory Visit: Payer: Self-pay | Admitting: Physician Assistant

## 2013-08-29 ENCOUNTER — Other Ambulatory Visit: Payer: Self-pay | Admitting: Physician Assistant

## 2013-08-30 NOTE — Telephone Encounter (Signed)
Medication refilled per protocol.Patient needs to be seen before any further refills 

## 2013-09-24 ENCOUNTER — Other Ambulatory Visit: Payer: Self-pay | Admitting: Family Medicine

## 2013-10-01 ENCOUNTER — Encounter: Payer: Self-pay | Admitting: Family Medicine

## 2013-10-01 ENCOUNTER — Ambulatory Visit (INDEPENDENT_AMBULATORY_CARE_PROVIDER_SITE_OTHER): Payer: 59 | Admitting: Family Medicine

## 2013-10-01 VITALS — BP 120/72 | HR 80 | Temp 97.1°F | Resp 16 | Ht 66.0 in | Wt 204.0 lb

## 2013-10-01 DIAGNOSIS — I10 Essential (primary) hypertension: Secondary | ICD-10-CM

## 2013-10-01 DIAGNOSIS — M109 Gout, unspecified: Secondary | ICD-10-CM | POA: Insufficient documentation

## 2013-10-01 DIAGNOSIS — J45909 Unspecified asthma, uncomplicated: Secondary | ICD-10-CM

## 2013-10-01 DIAGNOSIS — E785 Hyperlipidemia, unspecified: Secondary | ICD-10-CM

## 2013-10-01 DIAGNOSIS — Z125 Encounter for screening for malignant neoplasm of prostate: Secondary | ICD-10-CM

## 2013-10-01 LAB — CBC WITH DIFFERENTIAL/PLATELET
Basophils Absolute: 0 K/uL (ref 0.0–0.1)
Basophils Relative: 0 % (ref 0–1)
Eosinophils Absolute: 0.2 K/uL (ref 0.0–0.7)
Eosinophils Relative: 3 % (ref 0–5)
HCT: 39.8 % (ref 39.0–52.0)
Hemoglobin: 14.1 g/dL (ref 13.0–17.0)
Lymphocytes Relative: 27 % (ref 12–46)
Lymphs Abs: 1.7 K/uL (ref 0.7–4.0)
MCH: 29.7 pg (ref 26.0–34.0)
MCHC: 35.4 g/dL (ref 30.0–36.0)
MCV: 84 fL (ref 78.0–100.0)
Monocytes Absolute: 0.5 K/uL (ref 0.1–1.0)
Monocytes Relative: 8 % (ref 3–12)
Neutro Abs: 3.8 K/uL (ref 1.7–7.7)
Neutrophils Relative %: 62 % (ref 43–77)
Platelets: 279 K/uL (ref 150–400)
RBC: 4.74 MIL/uL (ref 4.22–5.81)
RDW: 12.9 % (ref 11.5–15.5)
WBC: 6.2 K/uL (ref 4.0–10.5)

## 2013-10-01 LAB — COMPLETE METABOLIC PANEL WITH GFR
ALBUMIN: 4.5 g/dL (ref 3.5–5.2)
ALK PHOS: 69 U/L (ref 39–117)
ALT: 38 U/L (ref 0–53)
AST: 26 U/L (ref 0–37)
BUN: 12 mg/dL (ref 6–23)
CHLORIDE: 101 meq/L (ref 96–112)
CO2: 24 meq/L (ref 19–32)
Calcium: 10.1 mg/dL (ref 8.4–10.5)
Creat: 0.99 mg/dL (ref 0.50–1.35)
GFR, EST NON AFRICAN AMERICAN: 87 mL/min
GFR, Est African American: >89 mL/min
GLUCOSE: 88 mg/dL (ref 70–99)
POTASSIUM: 4.1 meq/L (ref 3.5–5.3)
SODIUM: 137 meq/L (ref 135–145)
TOTAL PROTEIN: 7 g/dL (ref 6.0–8.3)
Total Bilirubin: 1.2 mg/dL (ref 0.2–1.2)

## 2013-10-01 LAB — LIPID PANEL
CHOL/HDL RATIO: 4.7 ratio
Cholesterol: 208 mg/dL — ABNORMAL HIGH (ref 0–200)
HDL: 44 mg/dL (ref >39)
LDL Cholesterol: 127 mg/dL — ABNORMAL HIGH (ref 0–99)
Triglycerides: 184 mg/dL — ABNORMAL HIGH (ref <150)
VLDL: 37 mg/dL (ref 0–40)

## 2013-10-01 LAB — PSA: PSA: 0.24 ng/mL

## 2013-10-01 LAB — URIC ACID: URIC ACID, SERUM: 5.9 mg/dL (ref 4.0–7.8)

## 2013-10-01 MED ORDER — SIMVASTATIN 40 MG PO TABS: ORAL_TABLET | ORAL | Status: DC

## 2013-10-01 MED ORDER — BUDESONIDE 180 MCG/ACT IN AEPB: 2.0000 | INHALATION_SPRAY | Freq: Two times a day (BID) | RESPIRATORY_TRACT | Status: DC

## 2013-10-01 MED ORDER — ALLOPURINOL 100 MG PO TABS: ORAL_TABLET | ORAL | Status: DC

## 2013-10-01 MED ORDER — LISINOPRIL 20 MG PO TABS: 20.0000 mg | ORAL_TABLET | Freq: Every day | ORAL | Status: DC

## 2013-10-01 MED ORDER — OMEPRAZOLE 40 MG PO CPDR: DELAYED_RELEASE_CAPSULE | ORAL | Status: DC

## 2013-10-01 MED ORDER — ALBUTEROL SULFATE HFA 108 (90 BASE) MCG/ACT IN AERS: 2.0000 | INHALATION_SPRAY | RESPIRATORY_TRACT | Status: DC | PRN

## 2013-10-01 NOTE — Progress Notes (Signed)
Subjective:    Patient ID: Austin Mcbride, male    DOB: [birth year], 53 y.o.   MRN: [id number]  HPI Patient is a very pleasant 53 year old white male who comes in for a medicine check. He has history of hypertension. He is currently taking the Cipro milligram tablet by mouth daily. His blood pressures well-controlled today 120/72. He denies any cough or shortness of breath. He denies any chest pain shortness of breath or dyspnea on exertion. He also has a history of gout. History taking allopurinol 200 mg by mouth daily. He rarely has to take colchicine. He states that once or twice a year he will feel a gout flare beginning to happen and take the colchicine prophylactically.  Otherwise he has not had any trouble with gout. He is also on simvastatin 40 mg by mouth daily for hyperlipidemia. He denies any myalgias or right quadrant pain. He is overdue for fasting lipid panel. He has as history of COPD/asthma. He is currently on Advair. He takes Ativan approximately 50% of the time. He has not used his rescue inhaler in years. He stopped smoking for several years. He questions if he needs to have varying longer. When he forgets to take the Advair he does not feel any problems or difficulty breathing. Past Medical History  Diagnosis Date  . Hypertension   . Hyperlipidemia   . COPD (chronic obstructive pulmonary disease)   . GERD (gastroesophageal reflux disease)   . Gout    Past Surgical History  Procedure Laterality Date  . Tonsillectomy    . Upper gastrointestinal endoscopy     Current Outpatient Prescriptions on File Prior to Visit  Medication Sig Dispense Refill  . aspirin 81 MG tablet Take 81 mg by mouth daily.        Marland Kitchen COLCRYS 0.6 MG tablet TAKE 2 TABS AT SIGN OF GOUT ATTACK,THEN 1 EVERY HOUR AFTER INTIAL DOSE,THEN 1 TWICE A DAY AS NEEDED  60 tablet  0  . diphenhydrAMINE (BENADRYL) 50 MG capsule Take 50 mg by mouth at bedtime.        . Fluticasone-Salmeterol (ADVAIR HFA IN) Inhale 1 puff  into the lungs daily as needed.        . loratadine (CLARITIN) 10 MG tablet Take 10 mg by mouth daily.         No current facility-administered medications on file prior to visit.   No Known Allergies History   Social History  . Marital Status: Married    Spouse Name: N/A    Number of Children: N/A  . Years of Education: N/A   Occupational History  . Not on file.   Social History Main Topics  . Smoking status: Former Smoker    Types: Cigarettes    Quit date: 06/23/2010  . Smokeless tobacco: Not on file  . Alcohol Use: 7.2 oz/week    12 Cans of beer per week  . Drug Use: Not on file  . Sexual Activity: Not on file   Other Topics Concern  . Not on file   Social History Narrative  . No narrative on file      Review of Systems  All other systems reviewed and are negative.       Objective:   Physical Exam  Vitals reviewed. Constitutional: He is oriented to person, place, and time. He appears well-developed and well-nourished. No distress.  HENT:  Head: Normocephalic.  Right Ear: External ear normal.  Left Ear: External ear normal.  Nose: Nose normal.  Mouth/Throat: Oropharynx is clear and moist. No oropharyngeal exudate.  Eyes: Conjunctivae and EOM are normal. Pupils are equal, round, and reactive to light. No scleral icterus.  Neck: Normal range of motion. Neck supple. No JVD present. No thyromegaly present.  Cardiovascular: Normal rate, regular rhythm and normal heart sounds.  Exam reveals no gallop and no friction rub.   No murmur heard. Pulmonary/Chest: Effort normal and breath sounds normal. No stridor. No respiratory distress. He has no wheezes. He has no rales. He exhibits no tenderness.  Abdominal: Soft. Bowel sounds are normal. He exhibits no distension. There is no tenderness. There is no rebound.  Musculoskeletal: He exhibits no edema.  Lymphadenopathy:    He has no cervical adenopathy.  Neurological: He is alert and oriented to person, place, and  time. No cranial nerve deficit. He exhibits normal muscle tone. Coordination normal.  Skin: Skin is warm. No rash noted. He is not diaphoretic. No erythema. No pallor.          Assessment & Plan:  1. Asthma, chronic Patient's asthma seems well controlled. I question whether he needs to have varying longer. Therefore I have asked him to discontinue Advair and will replace with pulmicort.  He continues to have no difficulty with his breathing, try to wean down and eventually stop altogether. I did give the patient he fell on his emergency inhaler - budesonide (PULMICORT) 180 MCG/ACT inhaler; Inhale 2 puffs into the lungs 2 (two) times daily.  Dispense: 1 Inhaler; Refill: 5  2. Gout Check uric acid level and increase out of her knowledge greater than 6 the - Uric acid  3. HLD (hyperlipidemia) Check fasting lipid panel. Goal LDL is less than 130. - COMPLETE METABOLIC PANEL WITH GFR - Lipid panel - CBC with Differential - Uric acid  4. HTN (hypertension) Blood pressures currently well controlled. Continue Symmetrel 20 mg by mouth daily.

## 2013-10-06 ENCOUNTER — Encounter: Payer: Self-pay | Admitting: Family Medicine

## 2014-04-29 ENCOUNTER — Other Ambulatory Visit: Payer: Self-pay | Admitting: Family Medicine

## 2014-04-29 MED ORDER — SIMVASTATIN 40 MG PO TABS: ORAL_TABLET | ORAL | Status: DC

## 2014-04-29 NOTE — Telephone Encounter (Signed)
Med sent for a 90 day supply

## 2014-05-03 ENCOUNTER — Encounter: Payer: Self-pay | Admitting: Internal Medicine

## 2014-09-28 ENCOUNTER — Other Ambulatory Visit: Payer: Self-pay | Admitting: Family Medicine

## 2014-10-24 ENCOUNTER — Other Ambulatory Visit: Payer: Self-pay | Admitting: Family Medicine

## 2014-10-26 ENCOUNTER — Other Ambulatory Visit: Payer: Self-pay | Admitting: Family Medicine

## 2014-10-26 NOTE — Telephone Encounter (Signed)
Medication refilled per protocol. 

## 2014-11-25 ENCOUNTER — Other Ambulatory Visit: Payer: Self-pay | Admitting: Family Medicine

## 2014-11-25 ENCOUNTER — Encounter: Payer: Self-pay | Admitting: Family Medicine

## 2014-11-25 NOTE — Telephone Encounter (Signed)
Pt has not been seen in over one year. Medication refill for one time only.  Patient needs to be seen.  Letter sent for patient to call and schedule 

## 2015-01-02 ENCOUNTER — Ambulatory Visit (INDEPENDENT_AMBULATORY_CARE_PROVIDER_SITE_OTHER): Payer: 59 | Admitting: Family Medicine

## 2015-01-02 ENCOUNTER — Encounter: Payer: Self-pay | Admitting: Family Medicine

## 2015-01-02 VITALS — BP 110/68 | HR 68 | Temp 98.1°F | Resp 16 | Ht 66.0 in | Wt 205.0 lb

## 2015-01-02 DIAGNOSIS — I10 Essential (primary) hypertension: Secondary | ICD-10-CM | POA: Diagnosis not present

## 2015-01-02 DIAGNOSIS — Z125 Encounter for screening for malignant neoplasm of prostate: Secondary | ICD-10-CM

## 2015-01-02 DIAGNOSIS — E785 Hyperlipidemia, unspecified: Secondary | ICD-10-CM | POA: Diagnosis not present

## 2015-01-02 DIAGNOSIS — J4531 Mild persistent asthma with (acute) exacerbation: Secondary | ICD-10-CM

## 2015-01-02 LAB — CBC WITH DIFFERENTIAL/PLATELET
Basophils Absolute: 0 10*3/uL (ref 0.0–0.1)
Basophils Relative: 0 % (ref 0–1)
Eosinophils Absolute: 0.2 10*3/uL (ref 0.0–0.7)
Eosinophils Relative: 2 % (ref 0–5)
HEMATOCRIT: 42.1 % (ref 39.0–52.0)
HEMOGLOBIN: 14.4 g/dL (ref 13.0–17.0)
LYMPHS ABS: 2.4 10*3/uL (ref 0.7–4.0)
Lymphocytes Relative: 28 % (ref 12–46)
MCH: 29.7 pg (ref 26.0–34.0)
MCHC: 34.2 g/dL (ref 30.0–36.0)
MCV: 86.8 fL (ref 78.0–100.0)
MPV: 9.9 fL (ref 8.6–12.4)
Monocytes Absolute: 0.6 10*3/uL (ref 0.1–1.0)
Monocytes Relative: 7 % (ref 3–12)
NEUTROS ABS: 5.4 10*3/uL (ref 1.7–7.7)
NEUTROS PCT: 63 % (ref 43–77)
Platelets: 258 10*3/uL (ref 150–400)
RBC: 4.85 MIL/uL (ref 4.22–5.81)
RDW: 12.9 % (ref 11.5–15.5)
WBC: 8.6 10*3/uL (ref 4.0–10.5)

## 2015-01-02 LAB — COMPLETE METABOLIC PANEL WITH GFR
ALT: 29 U/L (ref 0–53)
AST: 20 U/L (ref 0–37)
Albumin: 4.5 g/dL (ref 3.5–5.2)
Alkaline Phosphatase: 61 U/L (ref 39–117)
BUN: 9 mg/dL (ref 6–23)
CALCIUM: 10 mg/dL (ref 8.4–10.5)
CHLORIDE: 100 meq/L (ref 96–112)
CO2: 27 meq/L (ref 19–32)
Creat: 0.95 mg/dL (ref 0.50–1.35)
GFR, Est Non African American: >89 mL/min
Glucose, Bld: 77 mg/dL (ref 70–99)
Potassium: 4.3 mEq/L (ref 3.5–5.3)
Sodium: 136 mEq/L (ref 135–145)
Total Bilirubin: 1.1 mg/dL (ref 0.2–1.2)
Total Protein: 7 g/dL (ref 6.0–8.3)

## 2015-01-02 LAB — LIPID PANEL
Cholesterol: 191 mg/dL (ref 0–200)
HDL: 51 mg/dL (ref >=40)
LDL CALC: 112 mg/dL — AB (ref 0–99)
Total CHOL/HDL Ratio: 3.7 Ratio
Triglycerides: 142 mg/dL (ref <150)
VLDL: 28 mg/dL (ref 0–40)

## 2015-01-02 MED ORDER — OMEPRAZOLE 40 MG PO CPDR: 40.0000 mg | DELAYED_RELEASE_CAPSULE | Freq: Every day | ORAL | Status: DC

## 2015-01-02 MED ORDER — BUDESONIDE 180 MCG/ACT IN AEPB: INHALATION_SPRAY | RESPIRATORY_TRACT | Status: DC

## 2015-01-02 MED ORDER — LISINOPRIL 20 MG PO TABS: 20.0000 mg | ORAL_TABLET | Freq: Every day | ORAL | Status: DC

## 2015-01-02 MED ORDER — SIMVASTATIN 40 MG PO TABS: ORAL_TABLET | ORAL | Status: DC

## 2015-01-02 MED ORDER — ALLOPURINOL 100 MG PO TABS: 200.0000 mg | ORAL_TABLET | Freq: Every day | ORAL | Status: DC

## 2015-01-02 NOTE — Progress Notes (Signed)
Subjective:    Patient ID: Austin Mcbride, male    DOB: 01/31/1961, 54 y.o.   MRN: [id number]  HPI  Patient is a very pleasant 54 year old white male who comes in for a medicine check. He has history of hypertension. He is currently taking lisinopril 20 mg tablet by mouth daily. His blood pressures well-controlled today 110/68. He denies any cough or shortness of breath. He denies any chest pain shortness of breath or dyspnea on exertion. He also has a history of gout. History taking allopurinol 200 mg by mouth daily. He rarely has to take colchicine. He states that once or twice a year he will feel a gout flare beginning to happen and take the colchicine prophylactically.  Otherwise he has not had any trouble with gout. He is also on simvastatin 40 mg by mouth daily for hyperlipidemia. He denies any myalgias or right quadrant pain. He is overdue for fasting lipid panel. He has as history of COPD/asthma. He is currently on pulmicort twice daily.   However he is also using albuterol 2 times a day. He states that he doesn't have to use it , he thought he was supposed to. He states he rarely feels like he needs to use the albuterol. He is overdue for a prostate exam but he declines a digital rectal exam. He will consent for the PSA Past Medical History  Diagnosis Date  . Hypertension   . Hyperlipidemia   . COPD (chronic obstructive pulmonary disease)   . GERD (gastroesophageal reflux disease)   . Gout    Past Surgical History  Procedure Laterality Date  . Tonsillectomy    . Upper gastrointestinal endoscopy     Current Outpatient Prescriptions on File Prior to Visit  Medication Sig Dispense Refill  . COLCRYS 0.6 MG tablet TAKE 2 TABS AT SIGN OF GOUT ATTACK,THEN 1 EVERY HOUR AFTER INTIAL DOSE,THEN 1 TWICE A DAY AS NEEDED 60 tablet 0  . diphenhydrAMINE (BENADRYL) 50 MG capsule Take 50 mg by mouth at bedtime.      Marland Kitchen. PROAIR HFA 108 (90 BASE) MCG/ACT inhaler INHALE 2 PUFFS INTO THE LUNGS EVERY 4  (FOUR) HOURS AS NEEDED FOR WHEEZING OR SHORTNESS OF BREATH. 8.5 Inhaler 0   No current facility-administered medications on file prior to visit.   No Known Allergies History   Social History  . Marital Status: Married    Spouse Name: N/A  . Number of Children: N/A  . Years of Education: N/A   Occupational History  . Not on file.   Social History Main Topics  . Smoking status: Former Smoker    Types: Cigarettes    Quit date: 06/23/2010  . Smokeless tobacco: Not on file  . Alcohol Use: 7.2 oz/week    12 Cans of beer per week  . Drug Use: Not on file  . Sexual Activity: Not on file   Other Topics Concern  . Not on file   Social History Narrative      Review of Systems  All other systems reviewed and are negative.      Objective:   Physical Exam  Constitutional: He is oriented to person, place, and time. He appears well-developed and well-nourished. No distress.  HENT:  Head: Normocephalic.  Right Ear: External ear normal.  Left Ear: External ear normal.  Nose: Nose normal.  Mouth/Throat: Oropharynx is clear and moist. No oropharyngeal exudate.  Eyes: Conjunctivae and EOM are normal. Pupils are equal, round, and reactive to light. No scleral  icterus.  Neck: Normal range of motion. Neck supple. No JVD present. No thyromegaly present.  Cardiovascular: Normal rate, regular rhythm and normal heart sounds.  Exam reveals no gallop and no friction rub.   No murmur heard. Pulmonary/Chest: Effort normal and breath sounds normal. No stridor. No respiratory distress. He has no wheezes. He has no rales. He exhibits no tenderness.  Abdominal: Soft. Bowel sounds are normal. He exhibits no distension. There is no tenderness. There is no rebound.  Musculoskeletal: He exhibits no edema.  Lymphadenopathy:    He has no cervical adenopathy.  Neurological: He is alert and oriented to person, place, and time. No cranial nerve deficit. He exhibits normal muscle tone. Coordination  normal.  Skin: Skin is warm. No rash noted. He is not diaphoretic. No erythema. No pallor.  Vitals reviewed.         Assessment & Plan:  Benign essential HTN - Plan: COMPLETE METABOLIC PANEL WITH GFR, CBC with Differential/Platelet, Lipid panel  Prostate cancer screening - Plan: PSA  HLD (hyperlipidemia)  Asthma with acute exacerbation, mild persistent   gout is well controlled. I will not change his dose of allopurinol. Blood pressure is well controlled. I will not change his dose of senna pill. I will check a fasting lipid panel. Goal LDL cholesterol is less than 130. I will also check a PSA. I recommended the patient discontinue using albuterol and only use the medication if necessary. Meanwhile continue Pulmicort 2 inhalations twice daily as a preventative. I warned the patient about the increased risk of asthma-related death from the overuse of albuterol

## 2015-01-03 LAB — PSA: PSA: 0.25 ng/mL (ref <=4.00)

## 2015-01-04 ENCOUNTER — Encounter: Payer: Self-pay | Admitting: Family Medicine

## 2015-04-27 ENCOUNTER — Other Ambulatory Visit: Payer: Self-pay | Admitting: Family Medicine

## 2015-04-27 NOTE — Telephone Encounter (Signed)
Refill appropriate and filled per protocol. 

## 2015-10-25 ENCOUNTER — Other Ambulatory Visit: Payer: Self-pay | Admitting: Family Medicine

## 2016-01-29 ENCOUNTER — Other Ambulatory Visit: Payer: Self-pay | Admitting: Family Medicine

## 2016-02-03 ENCOUNTER — Other Ambulatory Visit: Payer: Self-pay | Admitting: Family Medicine

## 2016-02-13 DIAGNOSIS — K080 Exfoliation of teeth due to systemic causes: Secondary | ICD-10-CM | POA: Diagnosis not present

## 2016-03-04 ENCOUNTER — Encounter: Payer: Self-pay | Admitting: Physician Assistant

## 2016-03-04 ENCOUNTER — Other Ambulatory Visit: Payer: Self-pay | Admitting: Family Medicine

## 2016-03-04 ENCOUNTER — Ambulatory Visit (INDEPENDENT_AMBULATORY_CARE_PROVIDER_SITE_OTHER): Payer: 59 | Admitting: Physician Assistant

## 2016-03-04 VITALS — BP 116/80 | HR 64 | Temp 98.0°F | Resp 18 | Ht 66.0 in | Wt 210.0 lb

## 2016-03-04 DIAGNOSIS — J4520 Mild intermittent asthma, uncomplicated: Secondary | ICD-10-CM

## 2016-03-04 DIAGNOSIS — M109 Gout, unspecified: Secondary | ICD-10-CM

## 2016-03-04 DIAGNOSIS — Z125 Encounter for screening for malignant neoplasm of prostate: Secondary | ICD-10-CM | POA: Diagnosis not present

## 2016-03-04 DIAGNOSIS — I10 Essential (primary) hypertension: Secondary | ICD-10-CM

## 2016-03-04 DIAGNOSIS — E785 Hyperlipidemia, unspecified: Secondary | ICD-10-CM | POA: Diagnosis not present

## 2016-03-04 MED ORDER — SIMVASTATIN 40 MG PO TABS: 40.0000 mg | ORAL_TABLET | Freq: Every day | ORAL | Status: DC

## 2016-03-04 MED ORDER — LISINOPRIL 20 MG PO TABS: 20.0000 mg | ORAL_TABLET | Freq: Every day | ORAL | Status: DC

## 2016-03-04 MED ORDER — BUDESONIDE 180 MCG/ACT IN AEPB: INHALATION_SPRAY | RESPIRATORY_TRACT | Status: DC

## 2016-03-04 MED ORDER — ALBUTEROL SULFATE HFA 108 (90 BASE) MCG/ACT IN AERS: INHALATION_SPRAY | RESPIRATORY_TRACT | Status: DC

## 2016-03-04 MED ORDER — OMEPRAZOLE 40 MG PO CPDR: DELAYED_RELEASE_CAPSULE | ORAL | Status: DC

## 2016-03-04 MED ORDER — COLCHICINE 0.6 MG PO TABS: ORAL_TABLET | ORAL | Status: DC

## 2016-03-04 MED ORDER — ALLOPURINOL 100 MG PO TABS: 200.0000 mg | ORAL_TABLET | Freq: Every day | ORAL | Status: DC

## 2016-03-04 NOTE — Progress Notes (Signed)
Patient ID: Austin Mcbride MRN: [id number], DOB: [birth year], 55 y.o. Date of Encounter: @  Chief Complaint:  Chief Complaint  Patient presents with  . check up for med refills    is fasting    HPI: 55 y.o. year old male  presents with above.   Has no complaints or concerns today. Is taking blood pressure medication as directed. No lightheadedness or other adverse effects. Is taking cholesterol medicine as directed. No myalgias or other adverse effects. Is taking allopurinol daily. Has had no gout flare. Has not had to use Colcrys in the past year.   Past Medical History  Diagnosis Date  . Hypertension   . Hyperlipidemia   . COPD (chronic obstructive pulmonary disease) (HCC)   . GERD (gastroesophageal reflux disease)   . Gout      Home Meds: Outpatient Prescriptions Prior to Visit  Medication Sig Dispense Refill  . allopurinol (ZYLOPRIM) 100 MG tablet Take 2 tablets (200 mg total) by mouth daily. Needs office visit and labs before further refills 60 tablet 0  . COLCRYS 0.6 MG tablet TAKE 2 TABS AT SIGN OF GOUT ATTACK,THEN 1 EVERY HOUR AFTER INTIAL DOSE,THEN 1 TWICE A DAY AS NEEDED 60 tablet 0  . diphenhydrAMINE (BENADRYL) 50 MG capsule Take 50 mg by mouth at bedtime.      Marland Kitchen lisinopril (PRINIVIL,ZESTRIL) 20 MG tablet Take 1 tablet (20 mg total) by mouth daily. Needs office visit and labs before further refills 30 tablet 0  . omeprazole (PRILOSEC) 40 MG capsule TAKE 1 CAPSULE (40 MG TOTAL) BY MOUTH DAILY. 30 capsule 11  . PROAIR HFA 108 (90 BASE) MCG/ACT inhaler INHALE 2 PUFFS INTO THE LUNGS EVERY 4 (FOUR) HOURS AS NEEDED FOR WHEEZING OR SHORTNESS OF BREATH. 8.5 Inhaler 0  . PULMICORT FLEXHALER 180 MCG/ACT inhaler INHALE 2 PUFFS INTO THE LUNGS 2 (TWO) TIMES DAILY. 1 each 0  . simvastatin (ZOCOR) 40 MG tablet TAKE ONE TABLET BY MOUTH ONCE DAILY AT BEDTIME 30 tablet 10   No facility-administered medications prior to visit.    Allergies: No Known Allergies  Social  History   Social History  . Marital Status: Married    Spouse Name: N/A  . Number of Children: N/A  . Years of Education: N/A   Occupational History  . Not on file.   Social History Main Topics  . Smoking status: Current Every Day Smoker    Types: Cigarettes    Last Attempt to Quit: 06/23/2010  . Smokeless tobacco: Never Used  . Alcohol Use: 7.2 oz/week    12 Cans of beer per week  . Drug Use: Not on file  . Sexual Activity: Not on file   Other Topics Concern  . Not on file   Social History Narrative    Family History  Problem Relation Age of Onset  . Heart disease Father   . Breast cancer Paternal Grandmother   . Lung cancer Paternal Grandfather      Review of Systems:  See HPI for pertinent ROS. All other ROS negative.    Physical Exam: Blood pressure 116/80, pulse 64, temperature 98 F (36.7 C), temperature source Oral, resp. rate 18, height  (1.676 m), weight 210 lb (95.255 kg)., Body mass index is 33.91 kg/(m^2). General: WNWD WM. Appears in no acute distress. Neck: Supple. No thyromegaly. No lymphadenopathy. No carotid bruits. Lungs: Clear bilaterally to auscultation without wheezes, rales, or rhonchi. Breathing is unlabored. Heart: RRR with S1 S2. No murmurs, rubs,  or gallops. Abdomen: Soft, non-tender, non-distended with normoactive bowel sounds. No hepatomegaly. No rebound/guarding. No obvious abdominal masses. Musculoskeletal:  Strength and tone normal for age. Extremities/Skin: Warm and dry. No edema. Neuro: Alert and oriented X 3. Moves all extremities spontaneously. Gait is normal. CNII-XII grossly in tact. Psych:  Responds to questions appropriately with a normal affect.     ASSESSMENT AND PLAN:  55 y.o. year old male with  1. Hyperlipemia On simvastatin. Check labs to monitor. - COMPLETE METABOLIC PANEL WITH GFR - Lipid panel  2. Essential hypertension Blood Pressure is at goal. Check lab to monitor. - COMPLETE METABOLIC PANEL WITH  GFR  3. Asthma, mild intermittent, uncomplicated  4. Gout without tophus, unspecified cause, unspecified chronicity, unspecified site Stable/Controlled. No flare in the past year. - Uric acid  5. Prostate cancer screening Will check screening PSA while we are doing lab work. - PSA  Needs routine follow-up visit in 6 months or sooner if needed. Needs to schedule complete physical exam to update preventive care.  Signed, [address]Austin Mcbride, [place], [facility] CenterBSFM 56/05/2016 9:23 AM

## 2016-03-05 ENCOUNTER — Encounter: Payer: Self-pay | Admitting: Family Medicine

## 2016-03-05 LAB — COMPLETE METABOLIC PANEL WITH GFR
ALBUMIN: 4.5 g/dL (ref 3.6–5.1)
ALK PHOS: 60 U/L (ref 40–115)
ALT: 33 U/L (ref 9–46)
AST: 20 U/L (ref 10–35)
BILIRUBIN TOTAL: 0.9 mg/dL (ref 0.2–1.2)
BUN: 9 mg/dL (ref 7–25)
CO2: 26 mmol/L (ref 20–31)
Calcium: 9.6 mg/dL (ref 8.6–10.3)
Chloride: 102 mmol/L (ref 98–110)
Creat: 0.9 mg/dL (ref 0.70–1.33)
Glucose, Bld: 92 mg/dL (ref 70–99)
POTASSIUM: 4.4 mmol/L (ref 3.5–5.3)
Sodium: 138 mmol/L (ref 135–146)
TOTAL PROTEIN: 6.7 g/dL (ref 6.1–8.1)

## 2016-03-05 LAB — LIPID PANEL
Cholesterol: 187 mg/dL (ref 125–200)
HDL: 44 mg/dL (ref >=40)
LDL Cholesterol: 98 mg/dL (ref <130)
TRIGLYCERIDES: 225 mg/dL — AB (ref <150)
Total CHOL/HDL Ratio: 4.3 Ratio (ref <=5.0)
VLDL: 45 mg/dL — ABNORMAL HIGH (ref <30)

## 2016-03-05 LAB — URIC ACID: Uric Acid, Serum: 6.2 mg/dL (ref 4.0–8.0)

## 2016-03-05 LAB — PSA: PSA: 0.27 ng/mL (ref <=4.00)

## 2016-03-15 ENCOUNTER — Encounter: Payer: Self-pay | Admitting: Family Medicine

## 2016-03-15 ENCOUNTER — Encounter (HOSPITAL_COMMUNITY): Payer: Self-pay | Admitting: *Deleted

## 2016-03-15 ENCOUNTER — Emergency Department (HOSPITAL_COMMUNITY)
Admission: EM | Admit: 2016-03-15 | Discharge: 2016-03-15 | Disposition: A | Discharge status: Home / Self Care | Payer: Federal, State, Local not specified - PPO | Attending: Emergency Medicine | Admitting: Emergency Medicine

## 2016-03-15 ENCOUNTER — Ambulatory Visit (INDEPENDENT_AMBULATORY_CARE_PROVIDER_SITE_OTHER): Payer: 59 | Admitting: Family Medicine

## 2016-03-15 VITALS — BP 110/62 | HR 120 | Temp 100.1°F | Resp 20 | Ht 66.0 in | Wt 210.0 lb

## 2016-03-15 DIAGNOSIS — I10 Essential (primary) hypertension: Secondary | ICD-10-CM | POA: Insufficient documentation

## 2016-03-15 DIAGNOSIS — R51 Headache: Secondary | ICD-10-CM | POA: Diagnosis not present

## 2016-03-15 DIAGNOSIS — J449 Chronic obstructive pulmonary disease, unspecified: Secondary | ICD-10-CM | POA: Insufficient documentation

## 2016-03-15 DIAGNOSIS — Y92410 Unspecified street and highway as the place of occurrence of the external cause: Secondary | ICD-10-CM | POA: Insufficient documentation

## 2016-03-15 DIAGNOSIS — M545 Low back pain, unspecified: Secondary | ICD-10-CM

## 2016-03-15 DIAGNOSIS — F17210 Nicotine dependence, cigarettes, uncomplicated: Secondary | ICD-10-CM | POA: Insufficient documentation

## 2016-03-15 DIAGNOSIS — Y939 Activity, unspecified: Secondary | ICD-10-CM | POA: Insufficient documentation

## 2016-03-15 DIAGNOSIS — J069 Acute upper respiratory infection, unspecified: Secondary | ICD-10-CM | POA: Diagnosis not present

## 2016-03-15 DIAGNOSIS — B349 Viral infection, unspecified: Secondary | ICD-10-CM | POA: Diagnosis not present

## 2016-03-15 DIAGNOSIS — Y999 Unspecified external cause status: Secondary | ICD-10-CM | POA: Diagnosis not present

## 2016-03-15 MED ORDER — PREDNISONE 20 MG PO TABS
40.0000 mg | ORAL_TABLET | Freq: Once | ORAL | Status: AC
  Administered 2016-03-15: 40 mg via ORAL
  Filled 2016-03-15: qty 2

## 2016-03-15 MED ORDER — BENZONATATE 100 MG PO CAPS: 100.0000 mg | ORAL_CAPSULE | Freq: Three times a day (TID) | ORAL | Status: DC | PRN

## 2016-03-15 MED ORDER — ACETAMINOPHEN 325 MG PO TABS
650.0000 mg | ORAL_TABLET | Freq: Once | ORAL | Status: AC
  Administered 2016-03-15: 650 mg via ORAL
  Filled 2016-03-15: qty 2

## 2016-03-15 MED ORDER — METHOCARBAMOL 500 MG PO TABS: 500.0000 mg | ORAL_TABLET | Freq: Two times a day (BID) | ORAL | Status: DC

## 2016-03-15 MED ORDER — PREDNISONE 20 MG PO TABS: 40.0000 mg | ORAL_TABLET | Freq: Every day | ORAL | Status: DC

## 2016-03-15 MED ORDER — IBUPROFEN 800 MG PO TABS: 800.0000 mg | ORAL_TABLET | Freq: Three times a day (TID) | ORAL | Status: DC

## 2016-03-15 MED ORDER — CEFDINIR 300 MG PO CAPS: 300.0000 mg | ORAL_CAPSULE | Freq: Two times a day (BID) | ORAL | Status: DC

## 2016-03-15 MED ORDER — KETOROLAC TROMETHAMINE 60 MG/2ML IM SOLN
30.0000 mg | Freq: Once | INTRAMUSCULAR | Status: AC
  Administered 2016-03-15: 30 mg via INTRAMUSCULAR
  Filled 2016-03-15: qty 2

## 2016-03-15 MED ORDER — ALBUTEROL SULFATE (2.5 MG/3ML) 0.083% IN NEBU
2.5000 mg | INHALATION_SOLUTION | Freq: Once | RESPIRATORY_TRACT | Status: AC
  Administered 2016-03-15: 2.5 mg via RESPIRATORY_TRACT
  Filled 2016-03-15: qty 3
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
## 2016-03-15 NOTE — Progress Notes (Signed)
Subjective:    Patient ID: Austin Mcbride FeelingJohn F Bott, male    DOB: 02/07/1961, 55 y.o.   MRN: [id number]  HPI Wife was diagnosed with an upper respiratory infection earlier in the week and was given Omnicef. She was having body aches fevers chills and a severe sore throat. Strep test was negative. Now has been has developed the exact same symptoms. He reports fevers chills body aches. He believes his wife has strep throat. He is not having a severe sore throat but he is having all the other symptoms she is having. He is also coughing and wheezing. Past Medical History  Diagnosis Date  . Hypertension   . Hyperlipidemia   . COPD (chronic obstructive pulmonary disease) (HCC)   . GERD (gastroesophageal reflux disease)   . Gout    Past Surgical History  Procedure Laterality Date  . Tonsillectomy    . Upper gastrointestinal endoscopy     Current Outpatient Prescriptions on File Prior to Visit  Medication Sig Dispense Refill  . albuterol (PROAIR HFA) 108 (90 Base) MCG/ACT inhaler INHALE 2 PUFFS INTO THE LUNGS EVERY 4 (FOUR) HOURS AS NEEDED FOR WHEEZING OR SHORTNESS OF BREATH. 18 g 3  . allopurinol (ZYLOPRIM) 100 MG tablet Take 2 tablets (200 mg total) by mouth daily. 180 tablet 1  . budesonide (PULMICORT FLEXHALER) 180 MCG/ACT inhaler INHALE 2 PUFFS INTO THE LUNGS 2 (TWO) TIMES DAILY. 3 each 1  . colchicine (COLCRYS) 0.6 MG tablet TAKE 2 TABS AT SIGN OF GOUT ATTACK,THEN 1 EVERY HOUR AFTER INTIAL DOSE,THEN 1 TWICE A DAY AS NEEDED 60 tablet 0  . diphenhydrAMINE (BENADRYL) 50 MG capsule Take 50 mg by mouth at bedtime.      Marland Kitchen. lisinopril (PRINIVIL,ZESTRIL) 20 MG tablet Take 1 tablet (20 mg total) by mouth daily. 90 tablet 1  . omeprazole (PRILOSEC) 40 MG capsule TAKE 1 CAPSULE (40 MG TOTAL) BY MOUTH DAILY. 90 capsule 1  . simvastatin (ZOCOR) 40 MG tablet Take 1 tablet (40 mg total) by mouth daily at 6 PM. 90 tablet 1   No current facility-administered medications on file prior to visit.   No Known  Allergies Social History   Social History  . Marital Status: Married    Spouse Name: N/A  . Number of Children: N/A  . Years of Education: N/A   Occupational History  . Not on file.   Social History Main Topics  . Smoking status: Current Every Day Smoker    Types: Cigarettes    Last Attempt to Quit: 06/23/2010  . Smokeless tobacco: Never Used  . Alcohol Use: 7.2 oz/week    12 Cans of beer per week  . Drug Use: Not on file  . Sexual Activity: Not on file   Other Topics Concern  . Not on file   Social History Narrative      Review of Systems  All other systems reviewed and are negative.      Objective:   Physical Exam  Constitutional: He appears well-developed and well-nourished. No distress.  HENT:  Head: Normocephalic and atraumatic.  Right Ear: External ear normal.  Left Ear: External ear normal.  Nose: Nose normal.  Mouth/Throat: Oropharynx is clear and moist. No oropharyngeal exudate.  Neck: Neck supple.  Cardiovascular: Normal rate, regular rhythm and normal heart sounds.   Pulmonary/Chest: Effort normal and breath sounds normal. No respiratory distress. He has no wheezes. He has no rales.  Abdominal: Soft. Bowel sounds are normal.  Lymphadenopathy:    He has  no cervical adenopathy.  Skin: He is not diaphoretic.  Vitals reviewed.         Assessment & Plan:  Acute URI - Plan: cefdinir (OMNICEF) 300 MG capsule  Patient appears to have an upper respiratory infection similar to his wife's. Although most likely viral, his wife's has apparently responded Omnicef. Therefore I'll start the patient on Omnicef 300 mg by mouth twice a day for 10 days. Recheck in one week if no better or sooner if worse. He denies any tick bites area begin albuterol 2 puffs inhaled every 6 hours as needed for cough and wheezing. If wheezing worsens, patient may need prednisone

## 2016-03-15 NOTE — ED Notes (Signed)
See pa note for secondary assessment 
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
## 2016-08-08 DIAGNOSIS — K080 Exfoliation of teeth due to systemic causes: Secondary | ICD-10-CM | POA: Diagnosis not present

## 2016-09-07 ENCOUNTER — Other Ambulatory Visit: Payer: Self-pay | Admitting: Physician Assistant

## 2016-09-09 ENCOUNTER — Ambulatory Visit: Payer: 59 | Admitting: Physician Assistant

## 2016-09-16 ENCOUNTER — Other Ambulatory Visit: Payer: Self-pay | Admitting: Physician Assistant

## 2016-09-17 NOTE — Telephone Encounter (Signed)
Rx filled per protocol.Pt need Ov/ letter mailed

## 2016-10-04 ENCOUNTER — Ambulatory Visit (INDEPENDENT_AMBULATORY_CARE_PROVIDER_SITE_OTHER): Payer: 59 | Admitting: Family Medicine

## 2016-10-04 ENCOUNTER — Encounter: Payer: Self-pay | Admitting: Family Medicine

## 2016-10-04 VITALS — BP 118/80 | HR 68 | Temp 97.9°F | Resp 18 | Ht 66.0 in | Wt 210.0 lb

## 2016-10-04 DIAGNOSIS — E7800 Pure hypercholesterolemia, unspecified: Secondary | ICD-10-CM

## 2016-10-04 DIAGNOSIS — I10 Essential (primary) hypertension: Secondary | ICD-10-CM | POA: Diagnosis not present

## 2016-10-04 LAB — COMPLETE METABOLIC PANEL WITH GFR
ALT: 48 U/L — AB (ref 9–46)
AST: 31 U/L (ref 10–35)
Albumin: 4.4 g/dL (ref 3.6–5.1)
Alkaline Phosphatase: 65 U/L (ref 40–115)
BUN: 10 mg/dL (ref 7–25)
CO2: 26 mmol/L (ref 20–31)
CREATININE: 0.88 mg/dL (ref 0.70–1.33)
Calcium: 9.5 mg/dL (ref 8.6–10.3)
Chloride: 100 mmol/L (ref 98–110)
GFR, Est Non African American: >89 mL/min (ref >=60)
Glucose, Bld: 80 mg/dL (ref 70–99)
Potassium: 4.3 mmol/L (ref 3.5–5.3)
Sodium: 137 mmol/L (ref 135–146)
Total Bilirubin: 1.1 mg/dL (ref 0.2–1.2)
Total Protein: 6.9 g/dL (ref 6.1–8.1)

## 2016-10-04 LAB — CBC WITH DIFFERENTIAL/PLATELET
BASOS PCT: 0 %
Basophils Absolute: 0 cells/uL (ref 0–200)
EOS ABS: 124 {cells}/uL (ref 15–500)
Eosinophils Relative: 2 %
HEMATOCRIT: 42.4 % (ref 38.5–50.0)
Hemoglobin: 14.4 g/dL (ref 13.0–17.0)
Lymphocytes Relative: 33 %
Lymphs Abs: 2046 cells/uL (ref 850–3900)
MCH: 30 pg (ref 27.0–33.0)
MCHC: 34 g/dL (ref 32.0–36.0)
MCV: 88.3 fL (ref 80.0–100.0)
MONO ABS: 682 {cells}/uL (ref 200–950)
MONOS PCT: 11 %
MPV: 9.5 fL (ref 7.5–12.5)
NEUTROS ABS: 3348 {cells}/uL (ref 1500–7800)
Neutrophils Relative %: 54 %
PLATELETS: 211 10*3/uL (ref 140–400)
RBC: 4.8 MIL/uL (ref 4.20–5.80)
RDW: 13.4 % (ref 11.0–15.0)
WBC: 6.2 10*3/uL (ref 3.8–10.8)

## 2016-10-04 LAB — LIPID PANEL
Cholesterol: 199 mg/dL (ref <200)
HDL: 41 mg/dL (ref >40)
LDL CALC: 113 mg/dL — AB (ref <100)
TRIGLYCERIDES: 223 mg/dL — AB (ref <150)
Total CHOL/HDL Ratio: 4.9 Ratio (ref <5.0)
VLDL: 45 mg/dL — AB (ref <30)

## 2016-10-04 MED ORDER — ALLOPURINOL 100 MG PO TABS: 200.0000 mg | ORAL_TABLET | Freq: Every day | ORAL | 3 refills | Status: DC

## 2016-10-04 MED ORDER — LISINOPRIL 20 MG PO TABS: 20.0000 mg | ORAL_TABLET | Freq: Every day | ORAL | 3 refills | Status: DC

## 2016-10-04 MED ORDER — OMEPRAZOLE 40 MG PO CPDR: 40.0000 mg | DELAYED_RELEASE_CAPSULE | Freq: Every day | ORAL | 3 refills | Status: DC

## 2016-10-04 MED ORDER — SIMVASTATIN 40 MG PO TABS: ORAL_TABLET | ORAL | 3 refills | Status: DC

## 2016-10-04 MED ORDER — BUDESONIDE 180 MCG/ACT IN AEPB: INHALATION_SPRAY | RESPIRATORY_TRACT | 3 refills | Status: DC

## 2016-10-04 NOTE — Progress Notes (Signed)
Subjective:    Patient ID: Austin Mcbride, male    DOB: 05/02/1961, 56 y.o.   MRN: [id number]  HPI Patient is here today for medicine check. His blood pressure today is well controlled at 118/80. He denies any chest pain shortness of breath or dyspnea on exertion.  Asthma is well controlled. He is using Pulmicort every day but not requiring his rescue inhaler. He denies any myalgias or right upper quadrant pain on his statin. His flu shot is up-to-date. Colonoscopy is up-to-date and is not due again for 4 years. Past Medical History:  Diagnosis Date  . COPD (chronic obstructive pulmonary disease) (HCC)   . GERD (gastroesophageal reflux disease)   . Gout   . Hyperlipidemia   . Hypertension    Past Surgical History:  Procedure Laterality Date  . TONSILLECTOMY    . UPPER GASTROINTESTINAL ENDOSCOPY     Current Outpatient Prescriptions on File Prior to Visit  Medication Sig Dispense Refill  . albuterol (PROAIR HFA) 108 (90 Base) MCG/ACT inhaler INHALE 2 PUFFS INTO THE LUNGS EVERY 4 (FOUR) HOURS AS NEEDED FOR WHEEZING OR SHORTNESS OF BREATH. 18 g 3  . colchicine (COLCRYS) 0.6 MG tablet TAKE 2 TABS AT SIGN OF GOUT ATTACK,THEN 1 EVERY HOUR AFTER INTIAL DOSE,THEN 1 TWICE A DAY AS NEEDED 60 tablet 0  . diphenhydrAMINE (BENADRYL) 50 MG capsule Take 50 mg by mouth at bedtime.      Marland Kitchen. ibuprofen (ADVIL,MOTRIN) 800 MG tablet Take 1 tablet (800 mg total) by mouth 3 (three) times daily. 21 tablet 0   No current facility-administered medications on file prior to visit.    No Known Allergies Social History   Social History  . Marital status: Married    Spouse name: N/A  . Number of children: N/A  . Years of education: N/A   Occupational History  . Not on file.   Social History Main Topics  . Smoking status: Former Smoker    Types: Cigarettes    Quit date: 09/03/2016  . Smokeless tobacco: Never Used  . Alcohol use 7.2 oz/week    12 Cans of beer per week  . Drug use: Unknown  . Sexual  activity: Not on file   Other Topics Concern  . Not on file   Social History Narrative  . No narrative on file      Review of Systems  All other systems reviewed and are negative.      Objective:   Physical Exam  Constitutional: He is oriented to person, place, and time. He appears well-developed and well-nourished. No distress.  HENT:  Head: Normocephalic.  Right Ear: External ear normal.  Left Ear: External ear normal.  Nose: Nose normal.  Mouth/Throat: Oropharynx is clear and moist. No oropharyngeal exudate.  Eyes: Conjunctivae and EOM are normal. Pupils are equal, round, and reactive to light. No scleral icterus.  Neck: Normal range of motion. Neck supple. No JVD present. No thyromegaly present.  Cardiovascular: Normal rate, regular rhythm and normal heart sounds.  Exam reveals no gallop and no friction rub.   No murmur heard. Pulmonary/Chest: Effort normal and breath sounds normal. No stridor. No respiratory distress. He has no wheezes. He has no rales. He exhibits no tenderness.  Abdominal: Soft. Bowel sounds are normal. He exhibits no distension. There is no tenderness. There is no rebound.  Musculoskeletal: He exhibits no edema.  Lymphadenopathy:    He has no cervical adenopathy.  Neurological: He is alert and oriented to person, place,  and time. No cranial nerve deficit. He exhibits normal muscle tone. Coordination normal.  Skin: Skin is warm. No rash noted. He is not diaphoretic. No erythema. No pallor.  Vitals reviewed.         Assessment & Plan:  Essential hypertension - Plan: CBC with Differential/Platelet, COMPLETE METABOLIC PANEL WITH GFR, Lipid panel  Pure hypercholesterolemia  Blood pressures outstanding. PSA was checked in July 2017. Colonoscopy is not due for another 4 years. Flu shot is up-to-date. I will check a CMP and fasting lipid panel. I refilled his medications. Goal LDL cholesterol is less than 130

## 2016-10-07 ENCOUNTER — Encounter: Payer: Self-pay | Admitting: Family Medicine

## 2016-11-21 DIAGNOSIS — K080 Exfoliation of teeth due to systemic causes: Secondary | ICD-10-CM | POA: Diagnosis not present

## 2016-12-02 DIAGNOSIS — K080 Exfoliation of teeth due to systemic causes: Secondary | ICD-10-CM | POA: Diagnosis not present

## 2016-12-31 ENCOUNTER — Other Ambulatory Visit: Payer: Self-pay | Admitting: Physician Assistant

## 2017-09-11 ENCOUNTER — Other Ambulatory Visit: Payer: Self-pay | Admitting: Family Medicine

## 2017-10-07 DIAGNOSIS — K080 Exfoliation of teeth due to systemic causes: Secondary | ICD-10-CM | POA: Diagnosis not present

## 2017-10-18 DIAGNOSIS — H903 Sensorineural hearing loss, bilateral: Secondary | ICD-10-CM | POA: Diagnosis not present

## 2017-10-22 DIAGNOSIS — K080 Exfoliation of teeth due to systemic causes: Secondary | ICD-10-CM | POA: Diagnosis not present

## 2017-11-04 DIAGNOSIS — K080 Exfoliation of teeth due to systemic causes: Secondary | ICD-10-CM | POA: Diagnosis not present

## 2017-11-24 ENCOUNTER — Other Ambulatory Visit: Payer: Self-pay | Admitting: Family Medicine

## 2018-04-02 ENCOUNTER — Encounter: Payer: Self-pay | Admitting: Family Medicine

## 2018-04-02 ENCOUNTER — Other Ambulatory Visit: Payer: Self-pay | Admitting: Family Medicine

## 2018-04-02 ENCOUNTER — Ambulatory Visit: Payer: Federal, State, Local not specified - PPO | Admitting: Family Medicine

## 2018-04-02 VITALS — BP 110/76 | HR 60 | Temp 98.3°F | Resp 14 | Ht 66.0 in | Wt 207.0 lb

## 2018-04-02 DIAGNOSIS — Z125 Encounter for screening for malignant neoplasm of prostate: Secondary | ICD-10-CM | POA: Diagnosis not present

## 2018-04-02 DIAGNOSIS — Z1159 Encounter for screening for other viral diseases: Secondary | ICD-10-CM | POA: Diagnosis not present

## 2018-04-02 DIAGNOSIS — E7800 Pure hypercholesterolemia, unspecified: Secondary | ICD-10-CM

## 2018-04-02 DIAGNOSIS — Z23 Encounter for immunization: Secondary | ICD-10-CM

## 2018-04-02 DIAGNOSIS — I10 Essential (primary) hypertension: Secondary | ICD-10-CM

## 2018-04-02 MED ORDER — OMEPRAZOLE 40 MG PO CPDR: 40.0000 mg | DELAYED_RELEASE_CAPSULE | Freq: Every day | ORAL | 1 refills | Status: DC

## 2018-04-02 MED ORDER — SIMVASTATIN 40 MG PO TABS: 40.0000 mg | ORAL_TABLET | Freq: Every day | ORAL | 1 refills | Status: DC

## 2018-04-02 MED ORDER — COLCHICINE 0.6 MG PO TABS: ORAL_TABLET | ORAL | 0 refills | Status: DC

## 2018-04-02 MED ORDER — ALLOPURINOL 100 MG PO TABS: ORAL_TABLET | ORAL | 3 refills | Status: DC

## 2018-04-02 MED ORDER — LISINOPRIL 20 MG PO TABS: ORAL_TABLET | ORAL | 3 refills | Status: DC

## 2018-04-02 NOTE — Progress Notes (Signed)
Subjective:    Patient ID: Austin Mcbride Austin Mcbride, male    DOB: 06/16/1961, 57 y.o.   MRN: [id number]  Medication Refill    Patient is here today for medicine check.  He states that we would not refill his medicine without an appointment.  He denies any concerns.  He states that he is doing fine.  Regarding his general preventative care, his last colonoscopy was in 2012.  Aside from diverticulosis, no polyps were found.  Therefore his next colonoscopy is not due until 2022.  He has not had a PSA since 2017.  He is overdue for HIV as well as hepatitis C screening.  Patient states that he has no concern about possible HIV exposure.  However he would like to be screened for hepatitis C.  He denies any chest pain.  He denies any shortness of breath.  He denies any dyspnea on exertion.  His asthma/COPD has been well controlled.  He denies any exacerbations.  He seldom has to use his rescue medicine.  He denies any melena or hematochezia or nausea or vomiting.  He denies any weight loss.  He denies any recent gout exacerbation. Past Medical History:  Diagnosis Date  . COPD (chronic obstructive pulmonary disease) (HCC)   . GERD (gastroesophageal reflux disease)   . Gout   . Hyperlipidemia   . Hypertension    Past Surgical History:  Procedure Laterality Date  . TONSILLECTOMY    . UPPER GASTROINTESTINAL ENDOSCOPY     Current Outpatient Medications on File Prior to Visit  Medication Sig Dispense Refill  . albuterol (PROAIR HFA) 108 (90 Base) MCG/ACT inhaler INHALE 2 PUFFS INTO THE LUNGS EVERY 4 (FOUR) HOURS AS NEEDED FOR WHEEZING OR SHORTNESS OF BREATH. 18 g 3  . budesonide (PULMICORT FLEXHALER) 180 MCG/ACT inhaler INHALE 2 PUFFS INTO THE LUNGS 2 (TWO) TIMES DAILY. 3 each 3  . diphenhydrAMINE (BENADRYL) 50 MG capsule Take 50 mg by mouth at bedtime.      Marland Kitchen. ibuprofen (ADVIL,MOTRIN) 800 MG tablet Take 1 tablet (800 mg total) by mouth 3 (three) times daily. 21 tablet 0   No current facility-administered  medications on file prior to visit.    No Known Allergies Social History   Socioeconomic History  . Marital status: Married    Spouse name: Not on file  . Number of children: Not on file  . Years of education: Not on file  . Highest education level: Not on file  Occupational History  . Not on file  Social Needs  . Financial resource strain: Not on file  . Food insecurity:    Worry: Not on file    Inability: Not on file  . Transportation needs:    Medical: Not on file    Non-medical: Not on file  Tobacco Use  . Smoking status: Former Smoker    Types: Cigarettes    Last attempt to quit: 09/03/2016    Years since quitting: 1.5  . Smokeless tobacco: Never Used  Substance and Sexual Activity  . Alcohol use: Yes    Alcohol/week: 12.0 standard drinks    Types: 12 Cans of beer per week  . Drug use: Not on file  . Sexual activity: Not on file  Lifestyle  . Physical activity:    Days per week: Not on file    Minutes per session: Not on file  . Stress: Not on file  Relationships  . Social connections:    Talks on phone: Not on file  Gets together: Not on file    Attends religious service: Not on file    Active member of club or organization: Not on file    Attends meetings of clubs or organizations: Not on file    Relationship status: Not on file  . Intimate partner violence:    Fear of current or ex partner: Not on file    Emotionally abused: Not on file    Physically abused: Not on file    Forced sexual activity: Not on file  Other Topics Concern  . Not on file  Social History Narrative  . Not on file      Review of Systems  All other systems reviewed and are negative.      Objective:   Physical Exam  Constitutional: He is oriented to person, place, and time. He appears well-developed and well-nourished. No distress.  HENT:  Head: Normocephalic.  Right Ear: External ear normal.  Left Ear: External ear normal.  Nose: Nose normal.  Mouth/Throat: Oropharynx  is clear and moist. No oropharyngeal exudate.  Eyes: Pupils are equal, round, and reactive to light. Conjunctivae and EOM are normal. No scleral icterus.  Neck: Normal range of motion. Neck supple. No JVD present. No thyromegaly present.  Cardiovascular: Normal rate, regular rhythm and normal heart sounds. Exam reveals no gallop and no friction rub.  No murmur heard. Pulmonary/Chest: Effort normal and breath sounds normal. No stridor. No respiratory distress. He has no wheezes. He has no rales. He exhibits no tenderness.  Abdominal: Soft. Bowel sounds are normal. He exhibits no distension. There is no tenderness. There is no rebound.  Musculoskeletal: He exhibits no edema.  Lymphadenopathy:    He has no cervical adenopathy.  Neurological: He is alert and oriented to person, place, and time. No cranial nerve deficit. He exhibits normal muscle tone. Coordination normal.  Skin: Skin is warm. No rash noted. He is not diaphoretic. No erythema. No pallor.  Vitals reviewed.         Assessment & Plan:  Essential hypertension - Plan: CBC with Differential/Platelet, COMPLETE METABOLIC PANEL WITH GFR, Lipid panel  Pure hypercholesterolemia - Plan: CBC with Differential/Platelet, COMPLETE METABOLIC PANEL WITH GFR, Lipid panel  Encounter for hepatitis C screening test for low risk patient - Plan: Hepatitis C Antibody  Prostate cancer screening - Plan: PSA  The patient's blood pressure today is outstanding.  I will make no changes in his medication.  I will check a CMP along with a fasting lipid panel.  His goal LDL cholesterol is less than 130.  Patient is on a moderate intensity statin, simvastatin 40 mg a day.  He denies any myalgias or right upper quadrant pain.  I will screen the patient for prostate cancer with PSA.  Colon cancer screening is up-to-date.  He declines HIV screening.  He would like [place] to screen for hepatitis C which I will obtain on his lab work.  I recommended a flu shot this fall  particular given his asthma.  Patient received his tetanus vaccine today.  Medications were refilled.

## 2018-04-02 NOTE — Addendum Note (Signed)
Addended by: Legrand RamsWILLIS, Amareon Phung B on: 04/02/2018 10:59 AM   Modules accepted: Orders

## 2018-04-03 ENCOUNTER — Encounter: Payer: Self-pay | Admitting: Family Medicine

## 2018-04-03 LAB — LIPID PANEL
Cholesterol: 207 mg/dL — ABNORMAL HIGH (ref <200)
HDL: 46 mg/dL (ref >40)
LDL CHOLESTEROL (CALC): 130 mg/dL — AB
NON-HDL CHOLESTEROL (CALC): 161 mg/dL — AB (ref <130)
TRIGLYCERIDES: 177 mg/dL — AB (ref <150)
Total CHOL/HDL Ratio: 4.5 (calc) (ref <5.0)

## 2018-04-03 LAB — CBC WITH DIFFERENTIAL/PLATELET
BASOS PCT: 0.5 %
Basophils Absolute: 28 cells/uL (ref 0–200)
Eosinophils Absolute: 162 cells/uL (ref 15–500)
Eosinophils Relative: 2.9 %
HCT: 40.9 % (ref 38.5–50.0)
HEMOGLOBIN: 14.4 g/dL (ref 13.2–17.1)
Lymphs Abs: 1523 cells/uL (ref 850–3900)
MCH: 30.6 pg (ref 27.0–33.0)
MCHC: 35.2 g/dL (ref 32.0–36.0)
MCV: 86.8 fL (ref 80.0–100.0)
MPV: 10.1 fL (ref 7.5–12.5)
Monocytes Relative: 8.8 %
NEUTROS ABS: 3394 {cells}/uL (ref 1500–7800)
Neutrophils Relative %: 60.6 %
PLATELETS: 251 10*3/uL (ref 140–400)
RBC: 4.71 10*6/uL (ref 4.20–5.80)
RDW: 12.3 % (ref 11.0–15.0)
TOTAL LYMPHOCYTE: 27.2 %
WBC: 5.6 10*3/uL (ref 3.8–10.8)
WBCMIX: 493 {cells}/uL (ref 200–950)

## 2018-04-03 LAB — COMPLETE METABOLIC PANEL WITH GFR
AG RATIO: 1.8 (calc) (ref 1.0–2.5)
ALT: 27 U/L (ref 9–46)
AST: 21 U/L (ref 10–35)
Albumin: 4.5 g/dL (ref 3.6–5.1)
Alkaline phosphatase (APISO): 64 U/L (ref 40–115)
BUN: 11 mg/dL (ref 7–25)
CALCIUM: 9.7 mg/dL (ref 8.6–10.3)
CO2: 27 mmol/L (ref 20–32)
Chloride: 100 mmol/L (ref 98–110)
Creat: 0.96 mg/dL (ref 0.70–1.33)
GFR, EST AFRICAN AMERICAN: 101 mL/min/{1.73_m2} (ref >=60)
GFR, EST NON AFRICAN AMERICAN: 87 mL/min/{1.73_m2} (ref >=60)
GLUCOSE: 95 mg/dL (ref 65–99)
Globulin: 2.5 g/dL (calc) (ref 1.9–3.7)
Potassium: 5.1 mmol/L (ref 3.5–5.3)
Sodium: 137 mmol/L (ref 135–146)
TOTAL PROTEIN: 7 g/dL (ref 6.1–8.1)
Total Bilirubin: 0.9 mg/dL (ref 0.2–1.2)

## 2018-04-03 LAB — PSA: PSA: 0.2 ng/mL (ref <=4.0)

## 2018-04-03 LAB — HEPATITIS C ANTIBODY
Hepatitis C Ab: NONREACTIVE
SIGNAL TO CUT-OFF: 0.01 (ref <1.00)

## 2018-04-21 DIAGNOSIS — K080 Exfoliation of teeth due to systemic causes: Secondary | ICD-10-CM | POA: Diagnosis not present

## 2018-07-02 ENCOUNTER — Other Ambulatory Visit: Payer: Self-pay | Admitting: Family Medicine

## 2018-07-03 ENCOUNTER — Ambulatory Visit (INDEPENDENT_AMBULATORY_CARE_PROVIDER_SITE_OTHER): Payer: Federal, State, Local not specified - PPO

## 2018-07-03 DIAGNOSIS — Z23 Encounter for immunization: Secondary | ICD-10-CM

## 2018-07-03 NOTE — Progress Notes (Signed)
Patient came in today to receive annual flu vaccine. He received fluarix in the left deltoid. He tolerated well. VIS was given 

## 2018-09-29 ENCOUNTER — Other Ambulatory Visit: Payer: Self-pay | Admitting: Family Medicine

## 2018-12-26 ENCOUNTER — Other Ambulatory Visit: Payer: Self-pay | Admitting: Family Medicine

## 2019-01-01 ENCOUNTER — Other Ambulatory Visit: Payer: Self-pay | Admitting: Family Medicine

## 2019-01-03 ENCOUNTER — Other Ambulatory Visit: Payer: Self-pay | Admitting: Family Medicine

## 2019-04-01 ENCOUNTER — Other Ambulatory Visit: Payer: Self-pay | Admitting: Family Medicine

## 2019-06-16 ENCOUNTER — Other Ambulatory Visit: Payer: Self-pay

## 2019-06-16 ENCOUNTER — Ambulatory Visit (INDEPENDENT_AMBULATORY_CARE_PROVIDER_SITE_OTHER): Payer: Federal, State, Local not specified - PPO

## 2019-06-16 DIAGNOSIS — Z23 Encounter for immunization: Secondary | ICD-10-CM | POA: Diagnosis not present

## 2019-06-27 ENCOUNTER — Other Ambulatory Visit: Payer: Self-pay | Admitting: Family Medicine

## 2019-06-28 ENCOUNTER — Other Ambulatory Visit: Payer: Self-pay | Admitting: Family Medicine

## 2019-09-28 ENCOUNTER — Other Ambulatory Visit: Payer: Self-pay | Admitting: Family Medicine

## 2019-10-05 ENCOUNTER — Other Ambulatory Visit: Payer: Self-pay | Admitting: *Deleted

## 2019-10-05 ENCOUNTER — Other Ambulatory Visit: Payer: Self-pay | Admitting: Family Medicine

## 2019-10-05 MED ORDER — ALLOPURINOL 100 MG PO TABS: 200.0000 mg | ORAL_TABLET | Freq: Every day | ORAL | 0 refills | Status: DC

## 2019-10-11 ENCOUNTER — Ambulatory Visit: Payer: Federal, State, Local not specified - PPO | Admitting: Family Medicine

## 2019-10-11 ENCOUNTER — Encounter: Payer: Self-pay | Admitting: Family Medicine

## 2019-10-11 ENCOUNTER — Other Ambulatory Visit: Payer: Self-pay

## 2019-10-11 VITALS — BP 126/84 | HR 60 | Temp 97.8°F | Resp 12 | Ht 66.0 in | Wt 217.0 lb

## 2019-10-11 DIAGNOSIS — E7800 Pure hypercholesterolemia, unspecified: Secondary | ICD-10-CM

## 2019-10-11 DIAGNOSIS — Z125 Encounter for screening for malignant neoplasm of prostate: Secondary | ICD-10-CM | POA: Diagnosis not present

## 2019-10-11 DIAGNOSIS — J449 Chronic obstructive pulmonary disease, unspecified: Secondary | ICD-10-CM | POA: Diagnosis not present

## 2019-10-11 DIAGNOSIS — I10 Essential (primary) hypertension: Secondary | ICD-10-CM

## 2019-10-11 MED ORDER — ALBUTEROL SULFATE HFA 108 (90 BASE) MCG/ACT IN AERS: INHALATION_SPRAY | RESPIRATORY_TRACT | 3 refills | Status: DC

## 2019-10-11 NOTE — Progress Notes (Signed)
Subjective:    Patient ID: Austin [facility], male    DOB: 1961/01/10, 59 y.o.   MRN: [id number]  Patient is here today for a checkup.  He has a history of hypertension for which she currently takes lisinopril.  He states that his blood pressure is always less than 140/90.  He denies any chest pain.  He denies any shortness of breath.  He denies any dyspnea on exertion.  His asthma/COPD has been well controlled.  He denies any exacerbations.  Patient states that he has not used his albuterol in more than year.  Most of his albuterol has probably expired.  He denies any wheezing.  He states that ever since he quit smoking he has not had any further exacerbations yet he still takes his Pulmicort 2 puffs twice daily.  I do not think that he requires this any longer.  His flu shot is up-to-date.  He is due for Pneumovax 23 given his history of COPD.  We also discussed the Covid vaccination.  He is overdue for prostate cancer screening.  He denies any myalgias or right upper quadrant pain. Past Medical History:  Diagnosis Date  . COPD (chronic obstructive pulmonary disease) (HCC)   . GERD (gastroesophageal reflux disease)   . Gout   . Hyperlipidemia   . Hypertension    Past Surgical History:  Procedure Laterality Date  . TONSILLECTOMY    . UPPER GASTROINTESTINAL ENDOSCOPY     Current Outpatient Medications on File Prior to Visit  Medication Sig Dispense Refill  . allopurinol (ZYLOPRIM) 100 MG tablet TAKE 2 TABLETS(200 MG) BY MOUTH DAILY 180 tablet 0  . budesonide (PULMICORT FLEXHALER) 180 MCG/ACT inhaler INHALE 2 PUFFS INTO THE LUNGS TWICE DAILY 3 each 2  . colchicine (COLCRYS) 0.6 MG tablet TAKE 2 TABS AT SIGN OF GOUT ATTACK,THEN 1 EVERY HOUR AFTER INTIAL DOSE,THEN 1 TWICE A DAY AS NEEDED 60 tablet 0  . diphenhydrAMINE (BENADRYL) 50 MG capsule Take 50 mg by mouth at bedtime.      Marland Kitchen ibuprofen (ADVIL,MOTRIN) 800 MG tablet Take 1 tablet (800 mg total) by mouth 3 (three) times daily. 21 tablet 0  .  lisinopril (ZESTRIL) 20 MG tablet TAKE 1 TABLET BY MOUTH DAILY 90 tablet 1  . omeprazole (PRILOSEC) 40 MG capsule TAKE ONE CAPSULE BY MOUTH DAILY 30 capsule 0  . simvastatin (ZOCOR) 40 MG tablet TAKE 1 TABLET(40 MG) BY MOUTH DAILY 90 tablet 1   No current facility-administered medications on file prior to visit.   No Known Allergies Social History   Socioeconomic History  . Marital status: Married    Spouse name: Not on file  . Number of children: Not on file  . Years of education: Not on file  . Highest education level: Not on file  Occupational History  . Not on file  Tobacco Use  . Smoking status: Former Smoker    Types: Cigarettes    Quit date: 09/03/2016    Years since quitting: 3.1  . Smokeless tobacco: Never Used  Substance and Sexual Activity  . Alcohol use: Yes    Alcohol/week: 12.0 standard drinks    Types: 12 Cans of beer per week  . Drug use: Not on file  . Sexual activity: Not on file  Other Topics Concern  . Not on file  Social History Narrative  . Not on file   Social Determinants of Health   Financial Resource Strain:   . Difficulty of Paying Living Expenses: Not on  file  Food Insecurity:   . Worried About Charity fundraiser in the Last Year: Not on file  . Ran Out of Food in the Last Year: Not on file  Transportation Needs:   . Lack of Transportation (Medical): Not on file  . Lack of Transportation (Non-Medical): Not on file  Physical Activity:   . Days of Exercise per Week: Not on file  . Minutes of Exercise per Session: Not on file  Stress:   . Feeling of Stress : Not on file  Social Connections:   . Frequency of Communication with Friends and Family: Not on file  . Frequency of Social Gatherings with Friends and Family: Not on file  . Attends Religious Services: Not on file  . Active Member of Clubs or Organizations: Not on file  . Attends Archivist Meetings: Not on file  . Marital Status: Not on file  Intimate Partner Violence:     . Fear of Current or Ex-Partner: Not on file  . Emotionally Abused: Not on file  . Physically Abused: Not on file  . Sexually Abused: Not on file      Review of Systems  All other systems reviewed and are negative.      Objective:   Physical Exam Vitals reviewed.  Constitutional:      General: He is not in acute distress.    Appearance: He is well-developed. He is not diaphoretic.  HENT:     Head: Normocephalic.     Right Ear: External ear normal.     Left Ear: External ear normal.     Nose: Nose normal.     Mouth/Throat:     Pharynx: No oropharyngeal exudate.  Eyes:     General: No scleral icterus.    Conjunctiva/sclera: Conjunctivae normal.     Pupils: Pupils are equal, round, and reactive to light.  Neck:     Thyroid: No thyromegaly.     Vascular: No JVD.  Cardiovascular:     Rate and Rhythm: Normal rate and regular rhythm.     Heart sounds: Normal heart sounds. No murmur. No friction rub. No gallop.   Pulmonary:     Effort: Pulmonary effort is normal. No respiratory distress.     Breath sounds: Normal breath sounds. No stridor. No wheezing or rales.  Chest:     Chest wall: No tenderness.  Abdominal:     General: Bowel sounds are normal. There is no distension.     Palpations: Abdomen is soft.     Tenderness: There is no abdominal tenderness. There is no rebound.  Musculoskeletal:     Cervical back: Normal range of motion and neck supple.  Lymphadenopathy:     Cervical: No cervical adenopathy.  Skin:    General: Skin is warm.     Coloration: Skin is not pale.     Findings: No erythema or rash.  Neurological:     Mental Status: He is alert and oriented to person, place, and time.     Cranial Nerves: No cranial nerve deficit.     Motor: No abnormal muscle tone.     Coordination: Coordination normal.           Assessment & Plan:  Benign essential HTN - Plan: CBC with Differential/Platelet, COMPLETE METABOLIC PANEL WITH GFR, Lipid panel  Prostate  cancer screening - Plan: PSA  Pure hypercholesterolemia  COPD with asthma ([place])   The patient's blood pressure today is outstanding.  I will make no  changes in his medication.  I will check a CMP along with a fasting lipid panel.  His goal LDL cholesterol is less than 130.  Patient is on a moderate intensity statin, simvastatin 40 mg a day.  He denies any myalgias or right upper quadrant pain.  I will screen the patient for prostate cancer with PSA.  Colon cancer screening is up-to-date.  I recommended Pneumovax 23 however we were out of vaccinations dates he can return at any point for that vaccine in the future.  I also believe that the patient no longer requires Pulmicort.  Since discontinuing smoking, his asthma/COPD has had no further exacerbations in more than a year and a half.  Therefore we will discontinue Pulmicort.  I did refill his albuterol to have on hand just in case.  If he starts to have to use his rescue medicine more frequently, he can resume the Pulmicort.  I recommended the COVID-19 vaccination.

## 2019-10-12 ENCOUNTER — Encounter: Payer: Self-pay | Admitting: Family Medicine

## 2019-10-12 LAB — CBC WITH DIFFERENTIAL/PLATELET
Absolute Monocytes: 594 cells/uL (ref 200–950)
Basophils Absolute: 32 cells/uL (ref 0–200)
Basophils Relative: 0.6 %
Eosinophils Absolute: 200 cells/uL (ref 15–500)
Eosinophils Relative: 3.7 %
HCT: 39.9 % (ref 38.5–50.0)
Hemoglobin: 13.7 g/dL (ref 13.2–17.1)
Lymphs Abs: 1409 cells/uL (ref 850–3900)
MCH: 30.8 pg (ref 27.0–33.0)
MCHC: 34.3 g/dL (ref 32.0–36.0)
MCV: 89.7 fL (ref 80.0–100.0)
MPV: 10.7 fL (ref 7.5–12.5)
Monocytes Relative: 11 %
Neutro Abs: 3164 cells/uL (ref 1500–7800)
Neutrophils Relative %: 58.6 %
Platelets: 201 10*3/uL (ref 140–400)
RBC: 4.45 10*6/uL (ref 4.20–5.80)
RDW: 12.7 % (ref 11.0–15.0)
Total Lymphocyte: 26.1 %
WBC: 5.4 10*3/uL (ref 3.8–10.8)

## 2019-10-12 LAB — COMPLETE METABOLIC PANEL WITH GFR
AG Ratio: 2 (calc) (ref 1.0–2.5)
ALT: 30 U/L (ref 9–46)
AST: 22 U/L (ref 10–35)
Albumin: 4.5 g/dL (ref 3.6–5.1)
Alkaline phosphatase (APISO): 51 U/L (ref 35–144)
BUN: 13 mg/dL (ref 7–25)
CO2: 27 mmol/L (ref 20–32)
Calcium: 9.8 mg/dL (ref 8.6–10.3)
Chloride: 103 mmol/L (ref 98–110)
Creat: 1 mg/dL (ref 0.70–1.33)
GFR, Est African American: 95 mL/min/{1.73_m2} (ref >=60)
GFR, Est Non African American: 82 mL/min/{1.73_m2} (ref >=60)
Globulin: 2.2 g/dL (calc) (ref 1.9–3.7)
Glucose, Bld: 102 mg/dL — ABNORMAL HIGH (ref 65–99)
Potassium: 4.8 mmol/L (ref 3.5–5.3)
Sodium: 140 mmol/L (ref 135–146)
Total Bilirubin: 1.3 mg/dL — ABNORMAL HIGH (ref 0.2–1.2)
Total Protein: 6.7 g/dL (ref 6.1–8.1)

## 2019-10-12 LAB — LIPID PANEL
Cholesterol: 176 mg/dL (ref <200)
HDL: 45 mg/dL (ref >=40)
LDL Cholesterol (Calc): 106 mg/dL (calc) — ABNORMAL HIGH
Non-HDL Cholesterol (Calc): 131 mg/dL (calc) — ABNORMAL HIGH (ref <130)
Total CHOL/HDL Ratio: 3.9 (calc) (ref <5.0)
Triglycerides: 136 mg/dL (ref <150)

## 2019-10-12 LAB — PSA: PSA: 0.2 ng/mL (ref <=4.0)

## 2019-10-13 ENCOUNTER — Ambulatory Visit (INDEPENDENT_AMBULATORY_CARE_PROVIDER_SITE_OTHER): Payer: Federal, State, Local not specified - PPO | Admitting: Family Medicine

## 2019-10-13 ENCOUNTER — Other Ambulatory Visit: Payer: Self-pay

## 2019-10-13 DIAGNOSIS — Z23 Encounter for immunization: Secondary | ICD-10-CM | POA: Diagnosis not present

## 2019-12-19 ENCOUNTER — Encounter (HOSPITAL_COMMUNITY): Payer: Self-pay | Admitting: Emergency Medicine

## 2019-12-19 ENCOUNTER — Emergency Department (HOSPITAL_COMMUNITY)
Admission: EM | Admit: 2019-12-19 | Discharge: 2019-12-19 | Disposition: A | Discharge status: Home / Self Care | Payer: Federal, State, Local not specified - PPO | Attending: Emergency Medicine | Admitting: Emergency Medicine

## 2019-12-19 ENCOUNTER — Other Ambulatory Visit: Payer: Self-pay

## 2019-12-19 DIAGNOSIS — Z791 Long term (current) use of non-steroidal anti-inflammatories (NSAID): Secondary | ICD-10-CM | POA: Diagnosis not present

## 2019-12-19 DIAGNOSIS — H02841 Edema of right upper eyelid: Secondary | ICD-10-CM | POA: Diagnosis not present

## 2019-12-19 DIAGNOSIS — R519 Headache, unspecified: Secondary | ICD-10-CM | POA: Diagnosis not present

## 2019-12-19 DIAGNOSIS — J449 Chronic obstructive pulmonary disease, unspecified: Secondary | ICD-10-CM | POA: Diagnosis not present

## 2019-12-19 DIAGNOSIS — Z87891 Personal history of nicotine dependence: Secondary | ICD-10-CM | POA: Insufficient documentation

## 2019-12-19 DIAGNOSIS — H01001 Unspecified blepharitis right upper eyelid: Secondary | ICD-10-CM | POA: Diagnosis not present

## 2019-12-19 DIAGNOSIS — Z79899 Other long term (current) drug therapy: Secondary | ICD-10-CM | POA: Diagnosis not present

## 2019-12-19 DIAGNOSIS — I10 Essential (primary) hypertension: Secondary | ICD-10-CM | POA: Insufficient documentation

## 2019-12-19 DIAGNOSIS — H5702 Anisocoria: Secondary | ICD-10-CM | POA: Diagnosis not present

## 2019-12-19 MED ORDER — ERYTHROMYCIN 5 MG/GM OP OINT: TOPICAL_OINTMENT | OPHTHALMIC | 0 refills | Status: DC

## 2019-12-19 NOTE — Discharge Instructions (Addendum)
Please use antibiotic ointment for the next 7 days.  Applied twice daily.  Please take your blood pressure medications.  Return to ED if you have any new or concerning symptoms which is once that we discussed.   Your primary care doctor within the week to recheck your blood pressure and to discuss follow-up information about your visit here today.

## 2019-12-19 NOTE — ED Provider Notes (Signed)
Austin [facility] Provider Note   CSN: [id number] Arrival date & time: 12/19/19  1020     History Chief Complaint  Patient presents with  . EYE swelling    Austin Mcbride is a 59 y.o. male.  HPI  Patient is 59 year old gentleman with a history of COPD, HLD, HTN presented today with right eyelid swelling of the medial upper eyelid.  He states that this first began Friday has gotten somewhat worse since he denies any vision changes, eye pain, itching, discharge from eye or headache.  He states [facility] where he went without eating or take his blood pressure medications and was sent to [facility] because of his high blood pressure and told that there was concern for a brain tumor because of the patient's unequal pupil size.   Patient states that between [facility] and [facility] he ate a biscuit and drinks water and states that his headache work well.  He denies any progressive headaches or morning headaches or headaches with Valsalva.  Denies any neck pain stiffness, fevers, chills, nausea, vomiting, focal weakness numbness or paresthesias.  He states he is otherwise well feeling today.  Patient states that he normally takes his blood pressure medications regularly.  He states that he plans to take them today.  He states that he also plans to follow-up with his primary care doctor tomorrow.     Past Medical History:  Diagnosis Date  . COPD (chronic obstructive pulmonary disease) ([place])   . GERD (gastroesophageal reflux disease)   . Gout   . Hyperlipidemia   . Hypertension     Patient Active Problem List   Diagnosis Date Noted  . Prostate cancer screening 03/04/2016  . Gout   . COPD 02/13/2010  . Hyperlipemia 02/09/2010  . Essential hypertension 02/09/2010  . Asthma 02/09/2010  . ABDOMINAL PAIN, EPIGASTRIC 02/09/2010    Past Surgical History:  Procedure Laterality Date  .  TONSILLECTOMY    . UPPER GASTROINTESTINAL ENDOSCOPY         Family History  Problem Relation Age of Onset  . Heart disease Father   . Breast cancer Paternal Grandmother   . Lung cancer Paternal Grandfather     Social History   Tobacco Use  . Smoking status: Former Smoker    Types: Cigarettes    Quit date: 09/03/2016    Years since quitting: 3.2  . Smokeless tobacco: Never Used  Substance Use Topics  . Alcohol use: Yes    Alcohol/week: 12.0 standard drinks    Types: 12 Cans of beer per week  . Drug use: Not on file    Home Medications Prior to Admission medications   Medication Sig Start Date End Date Taking? Authorizing Provider  albuterol (PROAIR HFA) 108 (90 Base) MCG/ACT inhaler INHALE 2 PUFFS INTO THE LUNGS EVERY 4 (FOUR) HOURS AS NEEDED FOR WHEEZING OR SHORTNESS OF BREATH. 10/11/19   Austin Frizzle, MD  allopurinol (ZYLOPRIM) 100 MG tablet TAKE 2 TABLETS(200 MG) BY MOUTH DAILY 10/05/19   Austin Frizzle, MD  budesonide (PULMICORT FLEXHALER) 180 MCG/ACT inhaler INHALE 2 PUFFS INTO THE LUNGS TWICE DAILY 06/29/19   Austin Frizzle, MD  colchicine (COLCRYS) 0.6 MG tablet TAKE 2 TABS AT SIGN OF GOUT ATTACK,THEN 1 EVERY HOUR AFTER INTIAL DOSE,THEN 1 TWICE A DAY AS NEEDED 04/02/18   Austin Frizzle, MD  diphenhydrAMINE (BENADRYL) 50 MG capsule Take 50 mg  by mouth at bedtime.      [provider]  erythromycin ophthalmic ointment Place a 1/2 inch ribbon of ointment into the lower eyelid.  Please apply twice daily for the next 7 days. 12/19/19   Austin Shelter, PA  ibuprofen (ADVIL,MOTRIN) 800 MG tablet Take 1 tablet (800 mg total) by mouth 3 (three) times daily. 03/15/16   Austin, Ace Gins, PA-C  lisinopril (ZESTRIL) 20 MG tablet TAKE 1 TABLET BY MOUTH DAILY 01/04/19   Austin Brooks, MD  omeprazole (PRILOSEC) 40 MG capsule TAKE ONE CAPSULE BY MOUTH DAILY 09/28/19   Austin Brooks, MD  simvastatin (ZOCOR) 40 MG tablet TAKE 1 TABLET(40 MG) BY MOUTH DAILY 06/28/19    Austin Brooks, MD    Allergies    Patient has no known allergies.  Review of Systems   Review of Systems  Constitutional: Negative for chills and fever.  HENT: Negative for congestion.        Right upper eyelid swelling.  Respiratory: Negative for shortness of breath.   Cardiovascular: Negative for chest pain.  Gastrointestinal: Negative for abdominal pain.  Musculoskeletal: Negative for neck pain.    Physical Exam Updated Vital Signs BP (!) 159/96 (BP Location: Left Arm)   Pulse 84   Temp 98.5 F (36.9 C) (Oral)   Resp 17   Ht 5\' 6"  (1.676 m)   Wt 97.5 kg   SpO2 98%   BMI 34.70 kg/m   Physical Exam Vitals and nursing note reviewed.  Constitutional:      General: He is not in acute distress. HENT:     Head: Normocephalic and atraumatic.     Nose: Nose normal.  Eyes:     General: No scleral icterus.    Comments: Right pupil is 1 mm larger in size than left pupil.  They are both reactive to light and near accommodation.  He has no visual disturbances on my exam.  Visual fields are grossly intact.  Slight swelling of the right upper medial eyelid  Cardiovascular:     Rate and Rhythm: Normal rate and regular rhythm.     Pulses: Normal pulses.     Comments: DP/PT/radial pulses 3+ and symmetric Musculoskeletal:     Cervical back: Normal range of motion.     Right lower leg: No edema.     Left lower leg: No edema.  Skin:    General: Skin is warm and dry.     Capillary Refill: Capillary refill takes less than 2 seconds.  Neurological:     Mental Status: He is alert. Mental status is at baseline.     Comments: Alert and oriented to self, place, time and event.   Speech is fluent, clear without dysarthria or dysphasia.   Strength 5/5 in upper/lower extremities  Sensation intact in upper/lower extremities   Normal gait.  Negative Romberg. No pronator drift.  Normal finger-to-nose and feet tapping.  CN I not tested  CN II grossly intact visual fields  bilaterally. Did not visualize posterior eye.   CN III, IV, VI PRRLA and EOMs intact bilaterally  CN V Intact sensation to sharp and light touch to the face  CN VII facial movements symmetric  CN VIII not tested  CN IX, X no uvula deviation, symmetric rise of soft palate  CN XI 5/5 SCM and trapezius strength bilaterally  CN XII Midline tongue protrusion, symmetric L/R movements    Psychiatric:        Mood and Affect: Mood  normal.        Behavior: Behavior normal.     ED Results / Procedures / Treatments   Labs (all labs ordered are listed, but only abnormal results are displayed) Labs Reviewed - No data to display  EKG None  Radiology No results found.  Procedures Procedures (including critical care time)  Medications Ordered in ED Medications - No data to display  ED Course  I have reviewed the triage vital signs and the nursing notes.  Pertinent labs & imaging results that were available during my care of the patient were reviewed by me and considered in my medical decision making (see chart for details).    MDM Rules/Calculators/A&P                      Patient has history and physical exam consistent with blepharitis.  [facility] and [facility] for concerns for his abnormal pupil size.  Patient states that he has not noticed that his pupils look different.  He denies any headaches currently and states that although he had a headache earlier today at urgent care he ate some food and it went away.  He states he has not had morning headaches or progressive headaches.  He denies any history of cancer.  He used to smoke however he has not smoked in several years.  He denies any weakness, numbness, paresthesias.  Denies any visual field defects, vision changes his physical exam is notable for absolutely no neurologic dysfunction, his pupils are marginally different sizes but both reactive and he has no ophthalmoplegia, proptosis or  other ophthalmologic abnormalities.  He does have blepharitis which is mild and not severe enough to be causing any dry eye symptoms.  I shared decision-making conversation with the patient regarding whether he wanted further work-up at this time.  I stated that I had very low suspicion for brain cancer given his symptoms.  He is agreeable to discharge at this time and will follow up with his primary care doctor.  They may discuss further evaluation of his unequal pupil sizes at that time.  Patient is completely neurologically intact.  Vital signs are within normal limits apart from elevated blood pressure.  He will follow-up with his primary care doctor for this as well.  Although however he states that he has not taken his blood pressure medications today which is likely the reason for his blood pressure elevation.  He has no chest pain, shortness of breath, and no indication that he is having symptoms is related to his elevated blood pressure.  Patient given strict return precautions and he is understanding of symptoms of brain tumor and will return to ED immediately if he has a seizure, altered mental status, numbness, tingling, weakness in extremities or anywhere else.  He is agreeable to plan.   Final Clinical Impression(s) / ED Diagnoses Final diagnoses:  Blepharitis of right upper eyelid, unspecified type    Rx / DC Orders ED Discharge Orders         Ordered    erythromycin ophthalmic ointment     12/19/19 1349           Austin Mcbride, [place] 12/19/19 1353    Jacalyn Lefevre, MD 12/21/19 Rickey Primus

## 2019-12-19 NOTE — ED Triage Notes (Signed)
C/o R eye swelling since Friday.  Sent from [facility].

## 2019-12-24 ENCOUNTER — Other Ambulatory Visit: Payer: Self-pay | Admitting: Family Medicine

## 2020-03-20 ENCOUNTER — Other Ambulatory Visit: Payer: Self-pay | Admitting: Family Medicine

## 2020-03-21 ENCOUNTER — Other Ambulatory Visit: Payer: Self-pay

## 2020-03-21 MED ORDER — LISINOPRIL 20 MG PO TABS: 20.0000 mg | ORAL_TABLET | Freq: Every day | ORAL | 3 refills | Status: DC

## 2020-05-23 ENCOUNTER — Other Ambulatory Visit: Payer: Self-pay

## 2020-05-23 ENCOUNTER — Ambulatory Visit: Payer: Federal, State, Local not specified - PPO | Admitting: Physician Assistant

## 2020-05-23 ENCOUNTER — Encounter: Payer: Self-pay | Admitting: Physician Assistant

## 2020-05-23 DIAGNOSIS — Z1283 Encounter for screening for malignant neoplasm of skin: Secondary | ICD-10-CM

## 2020-05-23 DIAGNOSIS — L918 Other hypertrophic disorders of the skin: Secondary | ICD-10-CM | POA: Diagnosis not present

## 2020-05-23 DIAGNOSIS — L821 Other seborrheic keratosis: Secondary | ICD-10-CM

## 2020-05-23 NOTE — Progress Notes (Addendum)
   New Patient   Subjective  Austin Mcbride is a 59 y.o. male who presents for the following: Skin Problem (check spot left and right neck- "dark").   The following portions of the chart were reviewed this encounter and updated as appropriate: Tobacco  Allergies  Meds  Problems  Med Hx  Surg Hx  Fam Hx      Objective  Well appearing patient in no apparent distress; mood and affect are within normal limits.  A full examination was performed including scalp, head, eyes, ears, nose, lips, neck, chest, axillae, abdomen, back, buttocks, bilateral upper extremities, bilateral lower extremities, hands, feet, fingers, toes, fingernails, and toenails. All findings within normal limits unless otherwise noted below.  Objective  waist up and legs: No atypical nevi No signs of non-mole skin cancer.   Objective  Left Upper Arm - Anterior, Neck - Anterior (7), Right Axilla: Fleshy, skin-colored sessile and pedunculated papules.    Objective  Left chest: Stuck-on, dark waxy plaque  Assessment & Plan  Screening exam for skin cancer waist up and legs  Yearly skin exams  Skin tag (9) Neck - Anterior (7); Left Upper Arm - Anterior; Right Axilla  Destruction of lesion - Left Upper Arm - Anterior, Neck - Anterior, Right Axilla Complexity: simple   Destruction method comment:  Scissors were used to snip tag at the base Informed consent: discussed and consent obtained   Timeout:  patient name, date of birth, surgical site, and procedure verified Anesthesia: the lesion was anesthetized in a standard fashion   Hemostasis achieved with:  pressure Outcome: patient tolerated procedure well with no complications   Post-procedure details: wound care instructions given    Seborrheic keratosis Left chest  observe  I, Dimitriy Carreras, PA-C, have reviewed all documentation for this visit. The documentation on 05/24/20 for the exam, diagnosis, procedures, and orders are all accurate and  complete.

## 2020-06-16 ENCOUNTER — Other Ambulatory Visit: Payer: Self-pay | Admitting: Family Medicine

## 2020-07-05 ENCOUNTER — Telehealth: Payer: Self-pay

## 2020-07-05 ENCOUNTER — Other Ambulatory Visit: Payer: Self-pay

## 2020-07-05 ENCOUNTER — Ambulatory Visit (INDEPENDENT_AMBULATORY_CARE_PROVIDER_SITE_OTHER): Payer: Federal, State, Local not specified - PPO

## 2020-07-05 DIAGNOSIS — Z23 Encounter for immunization: Secondary | ICD-10-CM | POA: Diagnosis not present

## 2020-07-05 NOTE — Telephone Encounter (Signed)
Pt asked if his Provider recommended him getting [facility] vaccine booster?

## 2020-07-06 ENCOUNTER — Other Ambulatory Visit: Payer: Self-pay | Admitting: Family Medicine

## 2020-07-06 MED ORDER — TRIAMCINOLONE ACETONIDE 0.1 % EX CREA
1.0000 | TOPICAL_CREAM | Freq: Two times a day (BID) | CUTANEOUS | 0 refills | Status: DC
Start: 2020-07-06 — End: 2021-05-28

## 2020-07-06 NOTE — Telephone Encounter (Signed)
Called Pt gave him Providers advise for covid vaccine booster

## 2020-07-06 NOTE — Telephone Encounter (Signed)
If you had [place] and [facility], they recommend a booster with moderna or pfizer.

## 2020-09-02 ENCOUNTER — Other Ambulatory Visit: Payer: Self-pay

## 2020-09-02 ENCOUNTER — Other Ambulatory Visit: Payer: Federal, State, Local not specified - PPO

## 2020-09-02 DIAGNOSIS — Z20822 Contact with and (suspected) exposure to covid-19: Secondary | ICD-10-CM

## 2020-09-05 ENCOUNTER — Other Ambulatory Visit: Payer: Self-pay | Admitting: Family Medicine

## 2020-09-07 LAB — NOVEL CORONAVIRUS, NAA: SARS-CoV-2, NAA: NOT DETECTED

## 2020-11-24 ENCOUNTER — Other Ambulatory Visit: Payer: Self-pay | Admitting: Family Medicine

## 2020-11-29 ENCOUNTER — Other Ambulatory Visit: Payer: Self-pay | Admitting: Family Medicine

## 2020-12-03 ENCOUNTER — Other Ambulatory Visit: Payer: Self-pay | Admitting: Family Medicine

## 2021-02-26 ENCOUNTER — Other Ambulatory Visit: Payer: Self-pay | Admitting: Family Medicine

## 2021-04-09 ENCOUNTER — Encounter: Payer: Self-pay | Admitting: Gastroenterology

## 2021-05-27 ENCOUNTER — Other Ambulatory Visit: Payer: Self-pay | Admitting: Family Medicine

## 2021-05-28 ENCOUNTER — Other Ambulatory Visit: Payer: Self-pay

## 2021-05-28 ENCOUNTER — Ambulatory Visit: Payer: Federal, State, Local not specified - PPO | Admitting: Family Medicine

## 2021-05-28 ENCOUNTER — Encounter (INDEPENDENT_AMBULATORY_CARE_PROVIDER_SITE_OTHER): Payer: Self-pay | Admitting: *Deleted

## 2021-05-28 ENCOUNTER — Encounter: Payer: Self-pay | Admitting: Family Medicine

## 2021-05-28 VITALS — BP 132/76 | HR 60 | Temp 97.9°F | Resp 16 | Ht 66.0 in | Wt 177.0 lb

## 2021-05-28 DIAGNOSIS — I10 Essential (primary) hypertension: Secondary | ICD-10-CM

## 2021-05-28 DIAGNOSIS — Z23 Encounter for immunization: Secondary | ICD-10-CM | POA: Diagnosis not present

## 2021-05-28 DIAGNOSIS — Z125 Encounter for screening for malignant neoplasm of prostate: Secondary | ICD-10-CM

## 2021-05-28 DIAGNOSIS — Z1211 Encounter for screening for malignant neoplasm of colon: Secondary | ICD-10-CM

## 2021-05-28 DIAGNOSIS — E7800 Pure hypercholesterolemia, unspecified: Secondary | ICD-10-CM | POA: Diagnosis not present

## 2021-05-28 MED ORDER — OMEPRAZOLE 40 MG PO CPDR: DELAYED_RELEASE_CAPSULE | ORAL | 2 refills | Status: DC

## 2021-05-28 MED ORDER — LISINOPRIL 20 MG PO TABS: 20.0000 mg | ORAL_TABLET | Freq: Every day | ORAL | 2 refills | Status: DC

## 2021-05-28 MED ORDER — ALLOPURINOL 100 MG PO TABS: ORAL_TABLET | ORAL | 2 refills | Status: DC

## 2021-05-28 MED ORDER — SIMVASTATIN 40 MG PO TABS: ORAL_TABLET | ORAL | 2 refills | Status: DC

## 2021-05-28 MED ORDER — PULMICORT FLEXHALER 180 MCG/ACT IN AEPB: 2.0000 | INHALATION_SPRAY | Freq: Two times a day (BID) | RESPIRATORY_TRACT | 2 refills | Status: DC

## 2021-05-28 NOTE — Progress Notes (Signed)
Subjective:    Patient ID: Austin Mcbride, male    DOB: 06-09-1961, 60 y.o.   MRN: [id number]  Patient is here today for a checkup.  He has a history of hypertension as well as hyperlipidemia.   He has not had to use his albuterol in over a year.  Therefore I question if he really needs his Pulmicort.  He is overdue for a colonoscopy.  He is overdue for prostate cancer screening.  He is due for a flu shot.  He continues to smoke.  However he is planning on quitting.  He denies any chest pain shortness of breath or dyspnea on exertion Past Medical History:  Diagnosis Date   COPD (chronic obstructive pulmonary disease) (HCC)    GERD (gastroesophageal reflux disease)    Gout    Hyperlipidemia    Hypertension    Past Surgical History:  Procedure Laterality Date   TONSILLECTOMY     UPPER GASTROINTESTINAL ENDOSCOPY     Current Outpatient Medications on File Prior to Visit  Medication Sig Dispense Refill   albuterol (VENTOLIN HFA) 108 (90 Base) MCG/ACT inhaler INHALE 2 PUFFS INTO THE LUNGS EVERY 4 HOURS AS NEEDED FOR WHEEZING OR SHORTNESS OF BREATH 18 g 3   allopurinol (ZYLOPRIM) 100 MG tablet TAKE 2 TABLETS(200 MG) BY MOUTH DAILY 180 tablet 2   colchicine (COLCRYS) 0.6 MG tablet TAKE 2 TABS AT SIGN OF GOUT ATTACK,THEN 1 EVERY HOUR AFTER INTIAL DOSE,THEN 1 TWICE A DAY AS NEEDED 60 tablet 0   diphenhydrAMINE (BENADRYL) 50 MG capsule Take 50 mg by mouth at bedtime.       erythromycin ophthalmic ointment Place a 1/2 inch ribbon of ointment into the lower eyelid.  Please apply twice daily for the next 7 days. 1 g 0   ibuprofen (ADVIL,MOTRIN) 800 MG tablet Take 1 tablet (800 mg total) by mouth 3 (three) times daily. 21 tablet 0   lisinopril (ZESTRIL) 20 MG tablet TAKE 1 TABLET BY MOUTH DAILY 90 tablet 3   omeprazole (PRILOSEC) 40 MG capsule TAKE 1 CAPSULE(40 MG) BY MOUTH DAILY 90 capsule 3   PULMICORT FLEXHALER 180 MCG/ACT inhaler INHALE 2 PUFFS INTO THE LUNGS TWICE DAILY 3 each 2   simvastatin  (ZOCOR) 40 MG tablet TAKE 1 TABLET BY MOUTH DAILY AT 6 PM 90 tablet 0   triamcinolone cream (KENALOG) 0.1 % Apply 1 application topically 2 (two) times daily. 30 g 0   No current facility-administered medications on file prior to visit.   No Known Allergies Social History   Socioeconomic History   Marital status: Married    Spouse name: Not on file   Number of children: Not on file   Years of education: Not on file   Highest education level: Not on file  Occupational History   Not on file  Tobacco Use   Smoking status: Former    Types: Cigarettes    Quit date: 09/03/2016    Years since quitting: 4.7   Smokeless tobacco: Never  Substance and Sexual Activity   Alcohol use: Yes    Alcohol/week: 12.0 standard drinks    Types: 12 Cans of beer per week   Drug use: Not on file   Sexual activity: Not on file  Other Topics Concern   Not on file  Social History Narrative   Not on file   Social Determinants of Health   Financial Resource Strain: Not on file  Food Insecurity: Not on file  Transportation Needs: Not on  file  Physical Activity: Not on file  Stress: Not on file  Social Connections: Not on file  Intimate Partner Violence: Not on file      Review of Systems  All other systems reviewed and are negative.     Objective:   Physical Exam Vitals reviewed.  Constitutional:      General: He is not in acute distress.    Appearance: He is well-developed. He is not diaphoretic.  HENT:     Head: Normocephalic.     Right Ear: External ear normal.     Left Ear: External ear normal.     Nose: Nose normal.     Mouth/Throat:     Pharynx: No oropharyngeal exudate.  Eyes:     General: No scleral icterus.    Conjunctiva/sclera: Conjunctivae normal.     Pupils: Pupils are equal, round, and reactive to light.  Neck:     Thyroid: No thyromegaly.     Vascular: No JVD.  Cardiovascular:     Rate and Rhythm: Normal rate and regular rhythm.     Heart sounds: Normal heart  sounds. No murmur heard.   No friction rub. No gallop.  Pulmonary:     Effort: Pulmonary effort is normal. No respiratory distress.     Breath sounds: Normal breath sounds. No stridor. No wheezing or rales.  Chest:     Chest wall: No tenderness.  Abdominal:     General: Bowel sounds are normal. There is no distension.     Palpations: Abdomen is soft.     Tenderness: There is no abdominal tenderness. There is no rebound.  Musculoskeletal:     Cervical back: Normal range of motion and neck supple.  Lymphadenopathy:     Cervical: No cervical adenopathy.  Skin:    General: Skin is warm.     Coloration: Skin is not pale.     Findings: No erythema or rash.  Neurological:     Mental Status: He is alert and oriented to person, place, and time.     Cranial Nerves: No cranial nerve deficit.     Motor: No abnormal muscle tone.     Coordination: Coordination normal.          Assessment & Plan:  Prostate cancer screening - Plan: PSA  Pure hypercholesterolemia - Plan: CBC with Differential/Platelet, COMPLETE METABOLIC PANEL WITH GFR, Lipid panel  Benign essential HTN - Plan: CBC with Differential/Platelet, COMPLETE METABOLIC PANEL WITH GFR, Lipid panel  Colon cancer screening - Plan: Ambulatory referral to Gastroenterology  Need for immunization against influenza - Plan: Flu Vaccine QUAD 75mo+IM (Fluarix, Fluzone & Alfiuria Quad PF) I will schedule the patient for a colonoscopy.  Check a PSA to screen for prostate cancer.  Blood pressure is excellent.  Continue lisinopril at his current dose.  Check a CBC, CMP, fasting lipid panel.  Goal LDL cholesterol is less than 100.  Encourage the patient to quit smoking.  He received his flu shot today.

## 2021-05-29 LAB — COMPLETE METABOLIC PANEL WITH GFR
AG Ratio: 2.4 (calc) (ref 1.0–2.5)
ALT: 19 U/L (ref 9–46)
AST: 18 U/L (ref 10–35)
Albumin: 4.6 g/dL (ref 3.6–5.1)
Alkaline phosphatase (APISO): 56 U/L (ref 35–144)
BUN: 12 mg/dL (ref 7–25)
CO2: 28 mmol/L (ref 20–32)
Calcium: 9.8 mg/dL (ref 8.6–10.3)
Chloride: 102 mmol/L (ref 98–110)
Creat: 0.86 mg/dL (ref 0.70–1.35)
Globulin: 1.9 g/dL (calc) (ref 1.9–3.7)
Glucose, Bld: 92 mg/dL (ref 65–99)
Potassium: 4.7 mmol/L (ref 3.5–5.3)
Sodium: 139 mmol/L (ref 135–146)
Total Bilirubin: 0.7 mg/dL (ref 0.2–1.2)
Total Protein: 6.5 g/dL (ref 6.1–8.1)
eGFR: 99 mL/min/{1.73_m2} (ref >=60)

## 2021-05-29 LAB — CBC WITH DIFFERENTIAL/PLATELET
Absolute Monocytes: 530 cells/uL (ref 200–950)
Basophils Absolute: 29 cells/uL (ref 0–200)
Basophils Relative: 0.5 %
Eosinophils Absolute: 188 cells/uL (ref 15–500)
Eosinophils Relative: 3.3 %
HCT: 44.5 % (ref 38.5–50.0)
Hemoglobin: 15.1 g/dL (ref 13.2–17.1)
Lymphs Abs: 1630 cells/uL (ref 850–3900)
MCH: 31 pg (ref 27.0–33.0)
MCHC: 33.9 g/dL (ref 32.0–36.0)
MCV: 91.4 fL (ref 80.0–100.0)
MPV: 10.2 fL (ref 7.5–12.5)
Monocytes Relative: 9.3 %
Neutro Abs: 3323 cells/uL (ref 1500–7800)
Neutrophils Relative %: 58.3 %
Platelets: 209 10*3/uL (ref 140–400)
RBC: 4.87 10*6/uL (ref 4.20–5.80)
RDW: 11.8 % (ref 11.0–15.0)
Total Lymphocyte: 28.6 %
WBC: 5.7 10*3/uL (ref 3.8–10.8)

## 2021-05-29 LAB — LIPID PANEL
Cholesterol: 168 mg/dL (ref <200)
HDL: 57 mg/dL (ref >=40)
LDL Cholesterol (Calc): 95 mg/dL (calc)
Non-HDL Cholesterol (Calc): 111 mg/dL (calc) (ref <130)
Total CHOL/HDL Ratio: 2.9 (calc) (ref <5.0)
Triglycerides: 72 mg/dL (ref <150)

## 2021-05-29 LAB — PSA: PSA: 0.25 ng/mL (ref <=4.00)

## 2021-10-18 ENCOUNTER — Telehealth: Payer: Federal, State, Local not specified - PPO | Admitting: Physician Assistant

## 2021-10-18 DIAGNOSIS — J019 Acute sinusitis, unspecified: Secondary | ICD-10-CM | POA: Diagnosis not present

## 2021-10-18 DIAGNOSIS — B9689 Other specified bacterial agents as the cause of diseases classified elsewhere: Secondary | ICD-10-CM

## 2021-10-18 MED ORDER — AMOXICILLIN-POT CLAVULANATE 875-125 MG PO TABS: 1.0000 | ORAL_TABLET | Freq: Two times a day (BID) | ORAL | 0 refills | Status: DC

## 2021-10-18 NOTE — Progress Notes (Signed)

## 2021-10-19 ENCOUNTER — Ambulatory Visit: Payer: Federal, State, Local not specified - PPO | Admitting: Family Medicine

## 2021-11-08 ENCOUNTER — Other Ambulatory Visit: Payer: Self-pay

## 2021-11-08 ENCOUNTER — Ambulatory Visit: Payer: Federal, State, Local not specified - PPO | Admitting: Family Medicine

## 2021-11-08 ENCOUNTER — Encounter: Payer: Self-pay | Admitting: Family Medicine

## 2021-11-08 VITALS — BP 122/88 | HR 69 | Temp 97.2°F | Resp 18 | Ht 66.0 in | Wt 182.0 lb

## 2021-11-08 DIAGNOSIS — J4541 Moderate persistent asthma with (acute) exacerbation: Secondary | ICD-10-CM | POA: Diagnosis not present

## 2021-11-08 MED ORDER — PULMICORT FLEXHALER 180 MCG/ACT IN AEPB: 2.0000 | INHALATION_SPRAY | Freq: Two times a day (BID) | RESPIRATORY_TRACT | 3 refills | Status: DC

## 2021-11-08 MED ORDER — AMLODIPINE BESYLATE 5 MG PO TABS: 5.0000 mg | ORAL_TABLET | Freq: Every day | ORAL | 3 refills | Status: DC

## 2021-11-08 MED ORDER — PREDNISONE 20 MG PO TABS: ORAL_TABLET | ORAL | 0 refills | Status: DC

## 2021-11-08 NOTE — Progress Notes (Signed)
? ?Subjective:  ? ? Patient ID: Austin Mcbride, male    DOB: 09-19-1960, 61 y.o.   MRN: [id number] ? ?Cough ? ?Patient is a very pleasant 61 year old Caucasian gentleman who has a history of asthma.  When I last saw him I recommended stopping Pulmicort because he was doing so well and had not had an asthma exacerbation in quite some time.  About a month ago he developed a head cold.  He was treated with an E visit using Augmentin.  However he continues to have a cough that is clear.  He is wheezing.  He reports rhonchorous breath sounds.  He reports chest congestion.  He denies any fevers or chills or chest pain.  On examination today he has diminished breath sounds bilaterally with inspiratory and expiratory wheezing left greater than right.  He also has rhonchorous breath sounds.  I believe that either a virus or allergies is set off an asthma exacerbation. ?Past Medical History:  ?Diagnosis Date  ? COPD (chronic obstructive pulmonary disease) (HCC)   ? GERD (gastroesophageal reflux disease)   ? Gout   ? Hyperlipidemia   ? Hypertension   ? ?Past Surgical History:  ?Procedure Laterality Date  ? TONSILLECTOMY    ? UPPER GASTROINTESTINAL ENDOSCOPY    ? ?Current Outpatient Medications on File Prior to Visit  ?Medication Sig Dispense Refill  ? albuterol (VENTOLIN HFA) 108 (90 Base) MCG/ACT inhaler INHALE 2 PUFFS INTO THE LUNGS EVERY 4 HOURS AS NEEDED FOR WHEEZING OR SHORTNESS OF BREATH 18 g 3  ? allopurinol (ZYLOPRIM) 100 MG tablet TAKE 2 TABLETS(200 MG) BY MOUTH DAILY 180 tablet 2  ? colchicine (COLCRYS) 0.6 MG tablet TAKE 2 TABS AT SIGN OF GOUT ATTACK,THEN 1 EVERY HOUR AFTER INTIAL DOSE,THEN 1 TWICE A DAY AS NEEDED 60 tablet 0  ? ibuprofen (ADVIL,MOTRIN) 800 MG tablet Take 1 tablet (800 mg total) by mouth 3 (three) times daily. 21 tablet 0  ? lisinopril (ZESTRIL) 20 MG tablet Take 1 tablet (20 mg total) by mouth daily. 90 tablet 2  ? Melatonin 10 MG TABS Take by mouth.    ? omeprazole (PRILOSEC) 40 MG capsule TAKE 1  CAPSULE(40 MG) BY MOUTH DAILY 90 capsule 2  ? simvastatin (ZOCOR) 40 MG tablet TAKE 1 TABLET BY MOUTH DAILY AT 6 PM 90 tablet 0  ? ?No current facility-administered medications on file prior to visit.  ? ?No Known Allergies ?Social History  ? ?Socioeconomic History  ? Marital status: Married  ?  Spouse name: Not on file  ? Number of children: Not on file  ? Years of education: Not on file  ? Highest education level: Not on file  ?Occupational History  ? Not on file  ?Tobacco Use  ? Smoking status: Former  ?  Types: Cigarettes  ?  Quit date: 09/03/2016  ?  Years since quitting: 5.1  ? Smokeless tobacco: Never  ?Substance and Sexual Activity  ? Alcohol use: Yes  ?  Alcohol/week: 12.0 standard drinks  ?  Types: 12 Cans of beer per week  ? Drug use: Not on file  ? Sexual activity: Not on file  ?Other Topics Concern  ? Not on file  ?Social History Narrative  ? Not on file  ? ?Social Determinants of Health  ? ?Financial Resource Strain: Not on file  ?Food Insecurity: Not on file  ?Transportation Needs: Not on file  ?Physical Activity: Not on file  ?Stress: Not on file  ?Social Connections: Not on file  ?  Intimate Partner Violence: Not on file  ? ? ? ? ?Review of Systems  ?Respiratory:  Positive for cough.   ?All other systems reviewed and are negative. ? ?   ?Objective:  ? Physical Exam ?Constitutional:   ?   Appearance: Normal appearance.  ?HENT:  ?   Right Ear: Tympanic membrane and ear canal normal.  ?   Left Ear: Tympanic membrane and ear canal normal.  ?   Nose: Nose normal. No congestion or rhinorrhea.  ?   Mouth/Throat:  ?   Mouth: Mucous membranes are moist.  ?Cardiovascular:  ?   Rate and Rhythm: Normal rate and regular rhythm.  ?   Heart sounds: Normal heart sounds.  ?Pulmonary:  ?   Effort: Pulmonary effort is normal.  ?   Breath sounds: Wheezing and rhonchi present.  ?Neurological:  ?   Mental Status: He is alert.  ? ? ? ? ? ?   ?Assessment & Plan:  ? ?Moderate persistent asthma with exacerbation ?Believe the  patient likely has an asthma exacerbation triggered by viruses or allergies.  I recommended resuming Pulmicort as a preventative to help prevent this in the future.  Meanwhile I will treat the current exacerbation with a prednisone taper pack.  He also reports intermittent swelling in his mouth and his tongue and his lips.  I recommended discontinuation of lisinopril due to potential angioedema and replacing with amlodipine 5 mg a day. ?

## 2022-02-20 DIAGNOSIS — K080 Exfoliation of teeth due to systemic causes: Secondary | ICD-10-CM | POA: Diagnosis not present

## 2022-03-27 ENCOUNTER — Other Ambulatory Visit: Payer: Self-pay | Admitting: Family Medicine

## 2022-06-25 ENCOUNTER — Other Ambulatory Visit: Payer: Self-pay | Admitting: Family Medicine

## 2022-06-26 ENCOUNTER — Other Ambulatory Visit: Payer: Self-pay | Admitting: Family Medicine

## 2022-06-26 NOTE — Telephone Encounter (Signed)
Requested Prescriptions  Pending Prescriptions Disp Refills  . simvastatin (ZOCOR) 40 MG tablet [Pharmacy Med Name: SIMVASTATIN 40MG  TABLETS] 90 tablet 0    Sig: TAKE 1 TABLET BY MOUTH DAILY AT 6 PM     Cardiovascular:  Antilipid - Statins Failed - 06/26/2022  7:00 AM      Failed - Lipid Panel in normal range within the last 12 months    Cholesterol  Date Value Ref Range Status  05/28/2021 168 <200 mg/dL Final   LDL Cholesterol (Calc)  Date Value Ref Range Status  05/28/2021 95 mg/dL (calc) Final    Comment:    Reference range: <100 . Desirable range <100 mg/dL for primary prevention;   <70 mg/dL for patients with CHD or diabetic patients  with > or = 2 CHD risk factors. Marland Kitchen LDL-C is now calculated using the Martin-Hopkins  calculation, which is a validated novel method providing  better accuracy than the Friedewald equation in the  estimation of LDL-C.  Cresenciano Genre et al. Annamaria Helling. 7510;258(52): 2061-2068  (http://education.[facility].com/faq/FAQ164)    HDL  Date Value Ref Range Status  05/28/2021 57 > OR = 40 mg/dL Final   Triglycerides  Date Value Ref Range Status  05/28/2021 72 <150 mg/dL Final         Passed - Patient is not pregnant      Passed - Valid encounter within last 12 months    Recent Outpatient Visits          7 months ago Moderate persistent asthma with exacerbation   [place] Susy Frizzle, MD   1 year ago Prostate cancer screening   [place] Susy Frizzle, MD   2 years ago Benign essential HTN   [place] Schaumann, Cammie Mcgee, MD   4 years ago Essential hypertension   [place], Cammie Mcgee, MD   5 years ago Essential hypertension   [place], Cammie Mcgee, MD             . allopurinol (ZYLOPRIM) 100 MG tablet [Pharmacy Med Name: ALLOPURINOL 100MG  TABLETS] 180 tablet 0    Sig: TAKE 2 TABLETS(200 MG) BY MOUTH DAILY      Endocrinology:  Gout Agents - allopurinol Failed - 06/26/2022  7:00 AM      Failed - Uric Acid in normal range and within 360 days    Uric Acid, Serum  Date Value Ref Range Status  03/04/2016 6.2 4.0 - 8.0 mg/dL Final    Comment:    Therapeutic target for gout patients: <6.0 mg/dL         Failed - Cr in normal range and within 360 days    Creat  Date Value Ref Range Status  05/28/2021 0.86 0.70 - 1.35 mg/dL Final         Failed - CBC within normal limits and completed in the last 12 months    WBC  Date Value Ref Range Status  05/28/2021 5.7 3.8 - 10.8 Thousand/uL Final   RBC  Date Value Ref Range Status  05/28/2021 4.87 4.20 - 5.80 Million/uL Final   Hemoglobin  Date Value Ref Range Status  05/28/2021 15.1 13.2 - 17.1 g/dL Final   HCT  Date Value Ref Range Status  05/28/2021 44.5 38.5 - 50.0 % Final   MCHC  Date Value Ref Range Status  05/28/2021 33.9 32.0 - 36.0 g/dL Final   [facility]  Date Value Ref Range Status  05/28/2021 31.0 27.0 - 33.0 pg Final   MCV  Date Value Ref Range Status  05/28/2021 91.4 80.0 - 100.0 fL Final   No results found for: "PLTCOUNTKUC", "LABPLAT", "POCPLA" RDW  Date Value Ref Range Status  05/28/2021 11.8 11.0 - 15.0 % Final         Passed - Valid encounter within last 12 months    Recent Outpatient Visits          7 months ago Moderate persistent asthma with exacerbation   [facility] Donita Brooks, MD   1 year ago Prostate cancer screening   [facility] Donita Brooks, MD   2 years ago Benign essential HTN   [facility] Tanya Nones, Priscille Heidelberg, MD   4 years ago Essential hypertension   [facility] Pickard, Priscille Heidelberg, MD   5 years ago Essential hypertension   [facility] Pickard, Priscille Heidelberg, MD

## 2022-07-04 ENCOUNTER — Ambulatory Visit: Payer: Federal, State, Local not specified - PPO | Admitting: Family Medicine

## 2022-07-04 VITALS — BP 142/80 | HR 60 | Temp 98.1°F | Resp 16 | Wt 184.0 lb

## 2022-07-04 DIAGNOSIS — F17200 Nicotine dependence, unspecified, uncomplicated: Secondary | ICD-10-CM

## 2022-07-04 DIAGNOSIS — E7800 Pure hypercholesterolemia, unspecified: Secondary | ICD-10-CM | POA: Diagnosis not present

## 2022-07-04 DIAGNOSIS — Z125 Encounter for screening for malignant neoplasm of prostate: Secondary | ICD-10-CM | POA: Diagnosis not present

## 2022-07-04 DIAGNOSIS — I10 Essential (primary) hypertension: Secondary | ICD-10-CM | POA: Diagnosis not present

## 2022-07-04 LAB — COMPLETE METABOLIC PANEL WITH GFR
AG Ratio: 2 (calc) (ref 1.0–2.5)
ALT: 20 U/L (ref 9–46)
AST: 18 U/L (ref 10–35)
Albumin: 4.7 g/dL (ref 3.6–5.1)
Alkaline phosphatase (APISO): 58 U/L (ref 35–144)
BUN: 14 mg/dL (ref 7–25)
CO2: 28 mmol/L (ref 20–32)
Calcium: 10 mg/dL (ref 8.6–10.3)
Chloride: 103 mmol/L (ref 98–110)
Creat: 0.83 mg/dL (ref 0.70–1.35)
Globulin: 2.3 g/dL (calc) (ref 1.9–3.7)
Glucose, Bld: 95 mg/dL (ref 65–99)
Potassium: 4.4 mmol/L (ref 3.5–5.3)
Sodium: 141 mmol/L (ref 135–146)
Total Bilirubin: 1.3 mg/dL — ABNORMAL HIGH (ref 0.2–1.2)
Total Protein: 7 g/dL (ref 6.1–8.1)
eGFR: 100 mL/min/{1.73_m2} (ref >=60)

## 2022-07-04 LAB — LIPID PANEL
Cholesterol: 182 mg/dL (ref <200)
HDL: 54 mg/dL (ref >=40)
LDL Cholesterol (Calc): 106 mg/dL (calc) — ABNORMAL HIGH
Non-HDL Cholesterol (Calc): 128 mg/dL (calc) (ref <130)
Total CHOL/HDL Ratio: 3.4 (calc) (ref <5.0)
Triglycerides: 123 mg/dL (ref <150)

## 2022-07-04 LAB — CBC WITH DIFFERENTIAL/PLATELET
Absolute Monocytes: 515 cells/uL (ref 200–950)
Basophils Absolute: 28 cells/uL (ref 0–200)
Basophils Relative: 0.5 %
Eosinophils Absolute: 157 cells/uL (ref 15–500)
Eosinophils Relative: 2.8 %
HCT: 43.9 % (ref 38.5–50.0)
Hemoglobin: 15 g/dL (ref 13.2–17.1)
Lymphs Abs: 1786 cells/uL (ref 850–3900)
MCH: 30.5 pg (ref 27.0–33.0)
MCHC: 34.2 g/dL (ref 32.0–36.0)
MCV: 89.2 fL (ref 80.0–100.0)
MPV: 10.3 fL (ref 7.5–12.5)
Monocytes Relative: 9.2 %
Neutro Abs: 3114 cells/uL (ref 1500–7800)
Neutrophils Relative %: 55.6 %
Platelets: 215 10*3/uL (ref 140–400)
RBC: 4.92 10*6/uL (ref 4.20–5.80)
RDW: 12.2 % (ref 11.0–15.0)
Total Lymphocyte: 31.9 %
WBC: 5.6 10*3/uL (ref 3.8–10.8)

## 2022-07-04 LAB — PSA: PSA: 0.18 ng/mL (ref <=4.00)

## 2022-07-04 MED ORDER — AMLODIPINE BESYLATE 10 MG PO TABS: 10.0000 mg | ORAL_TABLET | Freq: Every day | ORAL | 3 refills | Status: DC

## 2022-07-04 MED ORDER — ALLOPURINOL 100 MG PO TABS: ORAL_TABLET | ORAL | 3 refills | Status: DC

## 2022-07-04 MED ORDER — SIMVASTATIN 40 MG PO TABS: ORAL_TABLET | ORAL | 3 refills | Status: DC

## 2022-07-04 MED ORDER — OMEPRAZOLE 40 MG PO CPDR: DELAYED_RELEASE_CAPSULE | ORAL | 2 refills | Status: DC

## 2022-07-04 NOTE — Progress Notes (Signed)
Subjective:    Patient ID: Austin Mcbride, male    DOB: 20-Jan-1961, 61 y.o.   MRN: [id number]  Patient is here today for a checkup.  He has a history of hypertension as well as hyperlipidemia.   His blood pressure slightly elevated today at 142/80.  He denies any chest pain shortness of breath or dyspnea on exertion.  He recently stopped smoking.  He does have a 20-pack-year history of smoking and therefore he is due for lung cancer screening.  He did not schedule his colonoscopy last year so this is due to be scheduled.  He has the contact information and he states that he will schedule this.  He is due for prostate cancer screening.  Otherwise he is doing well.  He has had his flu shot as well as his COVID booster. Past Medical History:  Diagnosis Date   COPD (chronic obstructive pulmonary disease) (HCC)    GERD (gastroesophageal reflux disease)    Gout    Hyperlipidemia    Hypertension    Past Surgical History:  Procedure Laterality Date   TONSILLECTOMY     UPPER GASTROINTESTINAL ENDOSCOPY     Current Outpatient Medications on File Prior to Visit  Medication Sig Dispense Refill   albuterol (VENTOLIN HFA) 108 (90 Base) MCG/ACT inhaler INHALE 2 PUFFS INTO THE LUNGS EVERY 4 HOURS AS NEEDED FOR WHEEZING OR SHORTNESS OF BREATH 18 g 3   allopurinol (ZYLOPRIM) 100 MG tablet TAKE 2 TABLETS(200 MG) BY MOUTH DAILY 180 tablet 0   amLODipine (NORVASC) 5 MG tablet Take 1 tablet (5 mg total) by mouth daily. 90 tablet 3   budesonide (PULMICORT FLEXHALER) 180 MCG/ACT inhaler Inhale 2 puffs into the lungs 2 (two) times daily. 3 each 3   colchicine (COLCRYS) 0.6 MG tablet TAKE 2 TABS AT SIGN OF GOUT ATTACK,THEN 1 EVERY HOUR AFTER INTIAL DOSE,THEN 1 TWICE A DAY AS NEEDED 60 tablet 0   ibuprofen (ADVIL,MOTRIN) 800 MG tablet Take 1 tablet (800 mg total) by mouth 3 (three) times daily. 21 tablet 0   Melatonin 10 MG TABS Take by mouth.     omeprazole (PRILOSEC) 40 MG capsule TAKE 1 CAPSULE(40 MG) BY MOUTH  DAILY 90 capsule 2   predniSONE (DELTASONE) 20 MG tablet 3 tabs poqday 1-2, 2 tabs poqday 3-4, 1 tab poqday 5-6 12 tablet 0   simvastatin (ZOCOR) 40 MG tablet TAKE 1 TABLET BY MOUTH DAILY AT 6 PM 90 tablet 0   No current facility-administered medications on file prior to visit.   Allergies  Allergen Reactions   Lisinopril Swelling   Social History   Socioeconomic History   Marital status: Married    Spouse name: Not on file   Number of children: Not on file   Years of education: Not on file   Highest education level: Not on file  Occupational History   Not on file  Tobacco Use   Smoking status: Former    Types: Cigarettes    Quit date: 09/03/2016    Years since quitting: 5.8   Smokeless tobacco: Never  Substance and Sexual Activity   Alcohol use: Yes    Alcohol/week: 12.0 standard drinks of alcohol    Types: 12 Cans of beer per week   Drug use: Not on file   Sexual activity: Not on file  Other Topics Concern   Not on file  Social History Narrative   Not on file   Social Determinants of Health   Financial Resource Strain:  Not on file  Food Insecurity: Not on file  Transportation Needs: Not on file  Physical Activity: Not on file  Stress: Not on file  Social Connections: Not on file  Intimate Partner Violence: Not on file      Review of Systems  All other systems reviewed and are negative.      Objective:   Physical Exam Vitals reviewed.  Constitutional:      General: He is not in acute distress.    Appearance: He is well-developed. He is not diaphoretic.  HENT:     Head: Normocephalic.     Right Ear: External ear normal.     Left Ear: External ear normal.     Nose: Nose normal.     Mouth/Throat:     Pharynx: No oropharyngeal exudate.  Eyes:     General: No scleral icterus.    Conjunctiva/sclera: Conjunctivae normal.     Pupils: Pupils are equal, round, and reactive to light.  Neck:     Thyroid: No thyromegaly.     Vascular: No JVD.   Cardiovascular:     Rate and Rhythm: Normal rate and regular rhythm.     Heart sounds: Normal heart sounds. No murmur heard.    No friction rub. No gallop.  Pulmonary:     Effort: Pulmonary effort is normal. No respiratory distress.     Breath sounds: Normal breath sounds. No stridor. No wheezing or rales.  Chest:     Chest wall: No tenderness.  Abdominal:     General: Bowel sounds are normal. There is no distension.     Palpations: Abdomen is soft.     Tenderness: There is no abdominal tenderness. There is no rebound.  Musculoskeletal:     Cervical back: Normal range of motion and neck supple.  Lymphadenopathy:     Cervical: No cervical adenopathy.  Skin:    General: Skin is warm.     Coloration: Skin is not pale.     Findings: No erythema or rash.  Neurological:     Mental Status: He is alert and oriented to person, place, and time.     Cranial Nerves: No cranial nerve deficit.     Motor: No abnormal muscle tone.     Coordination: Coordination normal.           Assessment & Plan:  Benign essential HTN - Plan: CBC with Differential/Platelet, Lipid panel, COMPLETE METABOLIC PANEL WITH GFR  Pure hypercholesterolemia - Plan: CBC with Differential/Platelet, Lipid panel, COMPLETE METABOLIC PANEL WITH GFR  Prostate cancer screening - Plan: PSA  Smoker - Plan: CT CHEST LUNG CA SCREEN LOW DOSE W/O CM Blood pressure is elevated today so we will increase amlodipine to 10 mg a day.  I congratulated the patient on trying to quit smoking, we will schedule him for a CT scan of his lungs to screen for lung cancer.  I encouraged him to schedule colonoscopy.  I will screen for prostate cancer with a PSA.  Check a CBC a CMP and a lipid panel.  Goal LDL cholesterol is less than 100

## 2022-08-16 ENCOUNTER — Ambulatory Visit (HOSPITAL_COMMUNITY)
Admission: RE | Admit: 2022-08-16 | Discharge: 2022-08-16 | Disposition: A | Discharge status: Home / Self Care | Payer: Federal, State, Local not specified - PPO | Source: Ambulatory Visit | Attending: Family Medicine | Admitting: Family Medicine

## 2022-08-16 DIAGNOSIS — F17200 Nicotine dependence, unspecified, uncomplicated: Secondary | ICD-10-CM | POA: Diagnosis not present

## 2022-08-16 DIAGNOSIS — Z87891 Personal history of nicotine dependence: Secondary | ICD-10-CM | POA: Diagnosis not present

## 2022-09-24 ENCOUNTER — Other Ambulatory Visit: Payer: Self-pay | Admitting: Family Medicine

## 2022-09-24 NOTE — Telephone Encounter (Signed)
Unable to refill per protocol, Rx request is too soon. Last refill 07/04/22 for 90 and 3 refills.  Requested Prescriptions  Pending Prescriptions Disp Refills   simvastatin (ZOCOR) 40 MG tablet [Pharmacy Med Name: SIMVASTATIN 40MG  TABLETS] 90 tablet 3    Sig: TAKE 1 TABLET BY MOUTH DAILY AT 6 PM     Cardiovascular:  Antilipid - Statins Failed - 09/24/2022  8:23 AM      Failed - Lipid Panel in normal range within the last 12 months    Cholesterol  Date Value Ref Range Status  07/04/2022 182 <200 mg/dL Final   LDL Cholesterol (Calc)  Date Value Ref Range Status  07/04/2022 106 (H) mg/dL (calc) Final    Comment:    Reference range: <100 . Desirable range <100 mg/dL for primary prevention;   <70 mg/dL for patients with CHD or diabetic patients  with > or = 2 CHD risk factors. Marland Kitchen LDL-C is now calculated using the Martin-Hopkins  calculation, which is a validated novel method providing  better accuracy than the Friedewald equation in the  estimation of LDL-C.  Cresenciano Genre et al. Annamaria Helling. 5009;381(82): 2061-2068  (http://education.[facility].com/faq/FAQ164)    HDL  Date Value Ref Range Status  07/04/2022 54 > OR = 40 mg/dL Final   Triglycerides  Date Value Ref Range Status  07/04/2022 123 <150 mg/dL Final         Passed - Patient is not pregnant      Passed - Valid encounter within last 12 months    Recent Outpatient Visits           10 months ago Moderate persistent asthma with exacerbation   [place] Susy Frizzle, MD   1 year ago Prostate cancer screening   [place] Susy Frizzle, MD   2 years ago Benign essential HTN   [place] Dennard Schaumann, Cammie Mcgee, MD   4 years ago Essential hypertension   [place], Cammie Mcgee, MD   5 years ago Essential hypertension   [place], Cammie Mcgee, MD               allopurinol (ZYLOPRIM) 100 MG tablet [Pharmacy Med  Name: ALLOPURINOL 100MG  TABLETS] 180 tablet 3    Sig: TAKE 2 TABLETS(200 MG) BY MOUTH DAILY     Endocrinology:  Gout Agents - allopurinol Failed - 09/24/2022  8:23 AM      Failed - Uric Acid in normal range and within 360 days    Uric Acid, Serum  Date Value Ref Range Status  03/04/2016 6.2 4.0 - 8.0 mg/dL Final    Comment:    Therapeutic target for gout patients: <6.0 mg/dL         Passed - Cr in normal range and within 360 days    Creat  Date Value Ref Range Status  07/04/2022 0.83 0.70 - 1.35 mg/dL Final         Passed - Valid encounter within last 12 months    Recent Outpatient Visits           10 months ago Moderate persistent asthma with exacerbation   [place] Susy Frizzle, MD   1 year ago Prostate cancer screening   [place] Susy Frizzle, MD   2 years ago Benign essential HTN   [place] Dennard Schaumann, Cammie Mcgee, MD   4 years ago Essential hypertension  [place] Pickard, Cammie Mcgee, MD   5 years ago Essential hypertension   [place] Pickard, Cammie Mcgee, MD              Passed - CBC within normal limits and completed in the last 12 months    WBC  Date Value Ref Range Status  07/04/2022 5.6 3.8 - 10.8 Thousand/uL Final   RBC  Date Value Ref Range Status  07/04/2022 4.92 4.20 - 5.80 Million/uL Final   Hemoglobin  Date Value Ref Range Status  07/04/2022 15.0 13.2 - 17.1 g/dL Final   HCT  Date Value Ref Range Status  07/04/2022 43.9 38.5 - 50.0 % Final   MCHC  Date Value Ref Range Status  07/04/2022 34.2 32.0 - 36.0 g/dL Final   [facility]  Date Value Ref Range Status  07/04/2022 30.5 27.0 - 33.0 pg Final   MCV  Date Value Ref Range Status  07/04/2022 89.2 80.0 - 100.0 fL Final   No results found for: "PLTCOUNTKUC", "LABPLAT", "POCPLA" RDW  Date Value Ref Range Status  07/04/2022 12.2 11.0 - 15.0 % Final

## 2022-12-02 ENCOUNTER — Other Ambulatory Visit: Payer: Self-pay | Admitting: Family Medicine

## 2023-01-13 ENCOUNTER — Other Ambulatory Visit: Payer: Self-pay | Admitting: Family Medicine

## 2023-01-14 NOTE — Telephone Encounter (Signed)
Requested Prescriptions  Pending Prescriptions Disp Refills   albuterol (VENTOLIN HFA) 108 (90 Base) MCG/ACT inhaler [Pharmacy Med Name: ALBUTEROL HFA INH(200 PUFFS) 18GM] 18 g 3    Sig: INHALE 2 PUFFS INTO THE LUNGS EVERY 4 HOURS AS NEEDED FOR WHEEZING OR SHORTNESS OF BREATH     Pulmonology:  Beta Agonists 2 Failed - 01/14/2023 10:40 AM      Failed - Last BP in normal range    BP Readings from Last 1 Encounters:  07/04/22 (!) 142/80         Failed - Valid encounter within last 12 months    Recent Outpatient Visits           1 year ago Moderate persistent asthma with exacerbation   [facility] Donita Brooks, MD   1 year ago Prostate cancer screening   [facility] Donita Brooks, MD   3 years ago Benign essential HTN   [facility] Tanya Nones, Priscille Heidelberg, MD   4 years ago Essential hypertension   [facility] Tanya Nones, Priscille Heidelberg, MD   6 years ago Essential hypertension   [facility] Pickard, Priscille Heidelberg, MD              Passed - Last Heart Rate in normal range    Pulse Readings from Last 1 Encounters:  07/04/22 60

## 2023-03-22 ENCOUNTER — Other Ambulatory Visit: Payer: Self-pay | Admitting: Family Medicine

## 2023-03-24 ENCOUNTER — Other Ambulatory Visit: Payer: Self-pay | Admitting: Family Medicine

## 2023-03-25 NOTE — Telephone Encounter (Signed)
Unable to refill per protocol, Rx request is too soon. Last refill 03/24/23 .E-Prescribing Status: Receipt confirmed by pharmacy (03/24/2023  9:06 AM EDT).  Requested Prescriptions  Pending Prescriptions Disp Refills   omeprazole (PRILOSEC) 40 MG capsule [Pharmacy Med Name: OMEPRAZOLE 40MG  CAPSULES] 90 capsule     Sig: TAKE 1 CAPSULE(40 MG) BY MOUTH DAILY     Gastroenterology: Proton Pump Inhibitors Failed - 03/24/2023  9:06 AM      Failed - Valid encounter within last 12 months    Recent Outpatient Visits           1 year ago Moderate persistent asthma with exacerbation   [facility] Donita Brooks, MD   1 year ago Prostate cancer screening   [facility] Donita Brooks, MD   3 years ago Benign essential HTN   [facility] Tanya Nones, Priscille Heidelberg, MD   4 years ago Essential hypertension   [facility] Pickard, Priscille Heidelberg, MD   6 years ago Essential hypertension   [facility] Pickard, Priscille Heidelberg, MD

## 2023-03-27 ENCOUNTER — Other Ambulatory Visit: Payer: Self-pay | Admitting: Family Medicine

## 2023-03-28 NOTE — Telephone Encounter (Signed)
Rx request was refilled 03/24/23, courtesy refill. OV needed.  Requested Prescriptions  Pending Prescriptions Disp Refills   omeprazole (PRILOSEC) 40 MG capsule [Pharmacy Med Name: OMEPRAZOLE 40MG  CAPSULES] 90 capsule     Sig: TAKE 1 CAPSULE(40 MG) BY MOUTH DAILY     Gastroenterology: Proton Pump Inhibitors Failed - 03/27/2023  2:35 PM      Failed - Valid encounter within last 12 months    Recent Outpatient Visits           1 year ago Moderate persistent asthma with exacerbation   [facility] Donita Brooks, MD   1 year ago Prostate cancer screening   [facility] Donita Brooks, MD   3 years ago Benign essential HTN   [facility] Tanya Nones, Priscille Heidelberg, MD   4 years ago Essential hypertension   [facility] Pickard, Priscille Heidelberg, MD   6 years ago Essential hypertension   [facility] Pickard, Priscille Heidelberg, MD

## 2023-06-24 ENCOUNTER — Other Ambulatory Visit: Payer: Self-pay | Admitting: Family Medicine

## 2023-06-24 NOTE — Telephone Encounter (Signed)
Requested by interface surescripts. Future visit in 1 week . Requested Prescriptions  Pending Prescriptions Disp Refills   amLODipine (NORVASC) 10 MG tablet [Pharmacy Med Name: AMLODIPINE BESYLATE 10MG  TABLETS] 90 tablet 0    Sig: TAKE 1 TABLET(10 MG) BY MOUTH DAILY     Cardiovascular: Calcium Channel Blockers 2 Failed - 06/24/2023  3:33 AM      Failed - Last BP in normal range    BP Readings from Last 1 Encounters:  07/04/22 (!) 142/80         Failed - Valid encounter within last 6 months    Recent Outpatient Visits           1 year ago Moderate persistent asthma with exacerbation   [facility] Donita Brooks, MD   2 years ago Prostate cancer screening   [facility] Donita Brooks, MD   3 years ago Benign essential HTN   [facility] Tanya Nones, Priscille Heidelberg, MD   5 years ago Essential hypertension   [facility] Tanya Nones, Priscille Heidelberg, MD   6 years ago Essential hypertension   [facility] Pickard, Priscille Heidelberg, MD       Future Appointments             In 1 week Pickard, Priscille Heidelberg, MD [facility]            Passed - Last Heart Rate in normal range    Pulse Readings from Last 1 Encounters:  07/04/22 60

## 2023-07-01 ENCOUNTER — Ambulatory Visit: Payer: Federal, State, Local not specified - PPO | Admitting: Family Medicine

## 2023-07-01 ENCOUNTER — Encounter (INDEPENDENT_AMBULATORY_CARE_PROVIDER_SITE_OTHER): Payer: Self-pay | Admitting: *Deleted

## 2023-07-01 VITALS — BP 140/80 | HR 60 | Temp 97.7°F | Ht 66.0 in | Wt 183.5 lb

## 2023-07-01 DIAGNOSIS — Z1211 Encounter for screening for malignant neoplasm of colon: Secondary | ICD-10-CM

## 2023-07-01 DIAGNOSIS — Z125 Encounter for screening for malignant neoplasm of prostate: Secondary | ICD-10-CM

## 2023-07-01 DIAGNOSIS — E7800 Pure hypercholesterolemia, unspecified: Secondary | ICD-10-CM

## 2023-07-01 DIAGNOSIS — I10 Essential (primary) hypertension: Secondary | ICD-10-CM

## 2023-07-01 MED ORDER — ALBUTEROL SULFATE HFA 108 (90 BASE) MCG/ACT IN AERS: INHALATION_SPRAY | RESPIRATORY_TRACT | 3 refills | Status: DC

## 2023-07-01 MED ORDER — PULMICORT FLEXHALER 180 MCG/ACT IN AEPB: 2.0000 | INHALATION_SPRAY | Freq: Two times a day (BID) | RESPIRATORY_TRACT | 11 refills | Status: AC

## 2023-07-01 NOTE — Progress Notes (Signed)
Subjective:    Patient ID: Austin Mcbride, male    DOB: 06-25-1961, 62 y.o.   MRN: [id number]  Patient is here today for a checkup.  He has a history of hypertension as well as hyperlipidemia.  His blood pressure is borderline today.  He denies any chest pain or shortness of breath.  He states that he had his blood pressure recently checked another doctor's appointment and his blood pressure is excellent.  He is overdue for a colonoscopy.  He is also due for prostate cancer screening.  Having quit smoking, the patient states that he seldom has to use his albuterol.  He is not sure if he still benefits from Pulmicort.  He cannot recall the last time he had an exacerbation. Past Medical History:  Diagnosis Date   COPD (chronic obstructive pulmonary disease) (HCC)    GERD (gastroesophageal reflux disease)    Gout    Hyperlipidemia    Hypertension    Past Surgical History:  Procedure Laterality Date   TONSILLECTOMY     UPPER GASTROINTESTINAL ENDOSCOPY     Current Outpatient Medications on File Prior to Visit  Medication Sig Dispense Refill   allopurinol (ZYLOPRIM) 100 MG tablet TAKE 2 TABLETS(200 MG) BY MOUTH DAILY 180 tablet 3   amLODipine (NORVASC) 10 MG tablet TAKE 1 TABLET(10 MG) BY MOUTH DAILY 90 tablet 0   omeprazole (PRILOSEC) 40 MG capsule TAKE 1 CAPSULE(40 MG) BY MOUTH DAILY 60 capsule 0   simvastatin (ZOCOR) 40 MG tablet TAKE 1 TABLET BY MOUTH DAILY AT 6 PM 90 tablet 3   colchicine (COLCRYS) 0.6 MG tablet TAKE 2 TABS AT SIGN OF GOUT ATTACK,THEN 1 EVERY HOUR AFTER INTIAL DOSE,THEN 1 TWICE A DAY AS NEEDED (Patient not taking: Reported on 07/01/2023) 60 tablet 0   ibuprofen (ADVIL,MOTRIN) 800 MG tablet Take 1 tablet (800 mg total) by mouth 3 (three) times daily. 21 tablet 0   Melatonin 10 MG TABS Take by mouth.     No current facility-administered medications on file prior to visit.   Allergies  Allergen Reactions   Lisinopril Swelling   Social History   Socioeconomic History    Marital status: Married    Spouse name: Not on file   Number of children: Not on file   Years of education: Not on file   Highest education level: 12th grade  Occupational History   Not on file  Tobacco Use   Smoking status: Former    Current packs/day: 0.00    Types: Cigarettes    Quit date: 09/03/2016    Years since quitting: 6.8   Smokeless tobacco: Never  Substance and Sexual Activity   Alcohol use: Yes    Alcohol/week: 12.0 standard drinks of alcohol    Types: 12 Cans of beer per week   Drug use: Not on file   Sexual activity: Not on file  Other Topics Concern   Not on file  Social History Narrative   Not on file   Social Determinants of Health   Financial Resource Strain: Low Risk  (07/01/2023)   Overall Financial Resource Strain (CARDIA)    Difficulty of Paying Living Expenses: Not hard at all  Food Insecurity: No Food Insecurity (07/01/2023)   Hunger Vital Sign    Worried About Running Out of Food in the Last Year: Never true    Ran Out of Food in the Last Year: Never true  Transportation Needs: No Transportation Needs (07/01/2023)   PRAPARE - Transportation  Lack of Transportation (Medical): No    Lack of Transportation (Non-Medical): No  Physical Activity: Sufficiently Active (07/01/2023)   Exercise Vital Sign    Days of Exercise per Week: 7 days    Minutes of Exercise per Session: 60 min  Stress: No Stress Concern Present (07/01/2023)   [employer] of Occupational Health - Occupational Stress Questionnaire    Feeling of Stress : Only a little  Social Connections: Unknown (07/01/2023)   Social Connection and Isolation Panel [NHANES]    Frequency of Communication with Friends and Family: Three times a week    Frequency of Social Gatherings with Friends and Family: Three times a week    Attends Religious Services: Patient declined    Active Member of Clubs or Organizations: Not on file    Attends Banker Meetings: Not on file    Marital  Status: Married  Intimate Partner Violence: Not on file      Review of Systems  All other systems reviewed and are negative.      Objective:   Physical Exam Vitals reviewed.  Constitutional:      General: He is not in acute distress.    Appearance: He is well-developed. He is not diaphoretic.  HENT:     Head: Normocephalic.     Right Ear: External ear normal.     Left Ear: External ear normal.     Nose: Nose normal.     Mouth/Throat:     Pharynx: No oropharyngeal exudate.  Eyes:     General: No scleral icterus.    Conjunctiva/sclera: Conjunctivae normal.     Pupils: Pupils are equal, round, and reactive to light.  Neck:     Thyroid: No thyromegaly.     Vascular: No JVD.  Cardiovascular:     Rate and Rhythm: Normal rate and regular rhythm.     Heart sounds: Normal heart sounds. No murmur heard.    No friction rub. No gallop.  Pulmonary:     Effort: Pulmonary effort is normal. No respiratory distress.     Breath sounds: Normal breath sounds. No stridor. No wheezing or rales.  Chest:     Chest wall: No tenderness.  Abdominal:     General: Bowel sounds are normal. There is no distension.     Palpations: Abdomen is soft.     Tenderness: There is no abdominal tenderness. There is no rebound.  Musculoskeletal:     Cervical back: Normal range of motion and neck supple.  Lymphadenopathy:     Cervical: No cervical adenopathy.  Skin:    General: Skin is warm.     Coloration: Skin is not pale.     Findings: No erythema or rash.  Neurological:     Mental Status: He is alert and oriented to person, place, and time.     Cranial Nerves: No cranial nerve deficit.     Motor: No abnormal muscle tone.     Coordination: Coordination normal.           Assessment & Plan:  Benign essential HTN - Plan: CBC with Differential/Platelet, COMPLETE METABOLIC PANEL WITH GFR, Lipid panel  Pure hypercholesterolemia - Plan: CBC with Differential/Platelet, COMPLETE METABOLIC PANEL WITH  GFR, Lipid panel  Prostate cancer screening - Plan: PSA  Colon cancer screening - Plan: Ambulatory referral to Gastroenterology Blood pressure today is borderline.  I will not change the dose of his amlodipine.  I did recommend that we try stopping the Pulmicort to see if this  medication is still needed.  If he notices an increase in the frequency of which she needs his albuterol, he will resume Pulmicort.  Check CBC CMP and a lipid panel.  I like to see his LDL cholesterol less than 161.  Check a PSA to screen for prostate cancer.  Schedule the patient for colonoscopy.  His immunizations are up-to-date.

## 2023-07-02 LAB — CBC WITH DIFFERENTIAL/PLATELET
Absolute Lymphocytes: 1609 {cells}/uL (ref 850–3900)
Absolute Monocytes: 508 {cells}/uL (ref 200–950)
Basophils Absolute: 38 {cells}/uL (ref 0–200)
Basophils Relative: 0.7 %
Eosinophils Absolute: 151 {cells}/uL (ref 15–500)
Eosinophils Relative: 2.8 %
HCT: 44.2 % (ref 38.5–50.0)
Hemoglobin: 14.6 g/dL (ref 13.2–17.1)
MCH: 29.6 pg (ref 27.0–33.0)
MCHC: 33 g/dL (ref 32.0–36.0)
MCV: 89.7 fL (ref 80.0–100.0)
MPV: 10.6 fL (ref 7.5–12.5)
Monocytes Relative: 9.4 %
Neutro Abs: 3094 {cells}/uL (ref 1500–7800)
Neutrophils Relative %: 57.3 %
Platelets: 245 10*3/uL (ref 140–400)
RBC: 4.93 10*6/uL (ref 4.20–5.80)
RDW: 12.4 % (ref 11.0–15.0)
Total Lymphocyte: 29.8 %
WBC: 5.4 10*3/uL (ref 3.8–10.8)

## 2023-07-02 LAB — LIPID PANEL
Cholesterol: 179 mg/dL (ref <200)
HDL: 68 mg/dL (ref >=40)
LDL Cholesterol (Calc): 90 mg/dL
Non-HDL Cholesterol (Calc): 111 mg/dL (ref <130)
Total CHOL/HDL Ratio: 2.6 (calc) (ref <5.0)
Triglycerides: 114 mg/dL (ref <150)

## 2023-07-02 LAB — COMPLETE METABOLIC PANEL WITH GFR
AG Ratio: 1.8 (calc) (ref 1.0–2.5)
ALT: 21 U/L (ref 9–46)
AST: 20 U/L (ref 10–35)
Albumin: 4.6 g/dL (ref 3.6–5.1)
Alkaline phosphatase (APISO): 59 U/L (ref 35–144)
BUN: 9 mg/dL (ref 7–25)
CO2: 29 mmol/L (ref 20–32)
Calcium: 10.1 mg/dL (ref 8.6–10.3)
Chloride: 102 mmol/L (ref 98–110)
Creat: 0.77 mg/dL (ref 0.70–1.35)
Globulin: 2.6 g/dL (ref 1.9–3.7)
Glucose, Bld: 98 mg/dL (ref 65–99)
Potassium: 4.2 mmol/L (ref 3.5–5.3)
Sodium: 140 mmol/L (ref 135–146)
Total Bilirubin: 0.8 mg/dL (ref 0.2–1.2)
Total Protein: 7.2 g/dL (ref 6.1–8.1)
eGFR: 101 mL/min/{1.73_m2} (ref >=60)

## 2023-07-02 LAB — PSA: PSA: 0.17 ng/mL (ref <=4.00)

## 2023-07-07 ENCOUNTER — Other Ambulatory Visit: Payer: Self-pay | Admitting: Family Medicine

## 2023-07-08 NOTE — Telephone Encounter (Signed)
Last OV 07/01/23 within protocol.  Requested Prescriptions  Pending Prescriptions Disp Refills   simvastatin (ZOCOR) 40 MG tablet [Pharmacy Med Name: SIMVASTATIN 40MG  TABLETS] 90 tablet 0    Sig: TAKE 1 TABLET BY MOUTH DAILY AT 6 PM     Cardiovascular:  Antilipid - Statins Failed - 07/07/2023  9:16 AM      Failed - Valid encounter within last 12 months    Recent Outpatient Visits           1 year ago Moderate persistent asthma with exacerbation   [facility] Donita Brooks, MD   2 years ago Prostate cancer screening   [facility] Donita Brooks, MD   3 years ago Benign essential HTN   [facility] Tanya Nones, Priscille Heidelberg, MD   5 years ago Essential hypertension   [facility] Tanya Nones, Priscille Heidelberg, MD   6 years ago Essential hypertension   [facility] Tanya Nones, Priscille Heidelberg, MD              Failed - Lipid Panel in normal range within the last 12 months    Cholesterol  Date Value Ref Range Status  07/01/2023 179 <200 mg/dL Final   LDL Cholesterol (Calc)  Date Value Ref Range Status  07/01/2023 90 mg/dL (calc) Final    Comment:    Reference range: <100 . Desirable range <100 mg/dL for primary prevention;   <70 mg/dL for patients with CHD or diabetic patients  with > or = 2 CHD risk factors. Marland Kitchen LDL-C is now calculated using the Martin-Hopkins  calculation, which is a validated novel method providing  better accuracy than the Friedewald equation in the  estimation of LDL-C.  Horald Pollen et al. Lenox Ahr. 5643;329(51): 2061-2068  (http://education.[employer].com/faq/FAQ164)    HDL  Date Value Ref Range Status  07/01/2023 68 > OR = 40 mg/dL Final   Triglycerides  Date Value Ref Range Status  07/01/2023 114 <150 mg/dL Final         Passed - Patient is not pregnant       omeprazole (PRILOSEC) 40 MG capsule [Pharmacy Med Name: OMEPRAZOLE 40MG  CAPSULES] 90 capsule 0    Sig: TAKE 1 CAPSULE(40  MG) BY MOUTH DAILY     Gastroenterology: Proton Pump Inhibitors Failed - 07/07/2023  9:16 AM      Failed - Valid encounter within last 12 months    Recent Outpatient Visits           1 year ago Moderate persistent asthma with exacerbation   [facility] Donita Brooks, MD   2 years ago Prostate cancer screening   [facility] Donita Brooks, MD   3 years ago Benign essential HTN   [facility] Tanya Nones, Priscille Heidelberg, MD   5 years ago Essential hypertension   [facility] Tanya Nones, Priscille Heidelberg, MD   6 years ago Essential hypertension   [facility] Tanya Nones, Priscille Heidelberg, MD               allopurinol (ZYLOPRIM) 100 MG tablet [Pharmacy Med Name: ALLOPURINOL 100MG  TABLETS] 180 tablet 0    Sig: TAKE 2 TABLETS(200 MG) BY MOUTH DAILY     Endocrinology:  Gout Agents - allopurinol Failed - 07/07/2023  9:16 AM      Failed - Uric Acid in normal range and within 360 days    Uric Acid, Serum  Date  Value Ref Range Status  03/04/2016 6.2 4.0 - 8.0 mg/dL Final    Comment:    Therapeutic target for gout patients: <6.0 mg/dL         Failed - Valid encounter within last 12 months    Recent Outpatient Visits           1 year ago Moderate persistent asthma with exacerbation   [facility] Donita Brooks, MD   2 years ago Prostate cancer screening   [facility] Donita Brooks, MD   3 years ago Benign essential HTN   [facility] Tanya Nones, Priscille Heidelberg, MD   5 years ago Essential hypertension   [facility] Donita Brooks, MD   6 years ago Essential hypertension   [facility] Pickard, Priscille Heidelberg, MD              Passed - Cr in normal range and within 360 days    Creat  Date Value Ref Range Status  07/01/2023 0.77 0.70 - 1.35 mg/dL Final         Passed - CBC within normal limits and completed in the last 12 months    WBC   Date Value Ref Range Status  07/01/2023 5.4 3.8 - 10.8 Thousand/uL Final   RBC  Date Value Ref Range Status  07/01/2023 4.93 4.20 - 5.80 Million/uL Final   Hemoglobin  Date Value Ref Range Status  07/01/2023 14.6 13.2 - 17.1 g/dL Final   HCT  Date Value Ref Range Status  07/01/2023 44.2 38.5 - 50.0 % Final   MCHC  Date Value Ref Range Status  07/01/2023 33.0 32.0 - 36.0 g/dL Final    Comment:    For adults, a slight decrease in the calculated MCHC value (in the range of 30 to 32 g/dL) is most likely not clinically significant; however, it should be interpreted with caution in correlation with other red cell parameters and the patient's clinical condition.    [facility]  Date Value Ref Range Status  07/01/2023 29.6 27.0 - 33.0 pg Final   MCV  Date Value Ref Range Status  07/01/2023 89.7 80.0 - 100.0 fL Final   No results found for: "PLTCOUNTKUC", "LABPLAT", "POCPLA" RDW  Date Value Ref Range Status  07/01/2023 12.4 11.0 - 15.0 % Final

## 2023-08-13 DIAGNOSIS — H903 Sensorineural hearing loss, bilateral: Secondary | ICD-10-CM | POA: Diagnosis not present

## 2023-09-01 ENCOUNTER — Ambulatory Visit: Payer: BC Managed Care – PPO | Admitting: Family Medicine

## 2023-09-01 VITALS — BP 140/90 | HR 74 | Temp 97.6°F | Ht 66.0 in | Wt 194.0 lb

## 2023-09-01 DIAGNOSIS — I10 Essential (primary) hypertension: Secondary | ICD-10-CM | POA: Diagnosis not present

## 2023-09-01 NOTE — Assessment & Plan Note (Signed)
 Chronic. Patient would not like additional medicaiton at this time and I agree considering he is unsure if he took his Amlodipine  this AM. Counseled on importance of medication compliance. He will return to office for BP check in 2 days and if remains his will start Hydrochlorothiazide 12.5mg  daily. Recommend heart healthy diet such as Mediterranean diet with whole grains, fruits, vegetable, fish, lean meats, nuts, and olive oil. Limit salt. Encouraged moderate walking, 3-5 times/week for 30-50 minutes each session. Aim for at least 150 minutes.week. Goal should be pace of 3 miles/hours, or walking 1.5 miles in 30 minutes. Avoid tobacco products. Avoid excess alcohol. Take medications as prescribed and bring medications and blood pressure log with cuff to each office visit. Seek medical care for chest pain, palpitations, shortness of breath with exertion, dizziness/lightheadedness, vision changes, recurrent headaches, or swelling of extremities.

## 2023-09-01 NOTE — Progress Notes (Signed)
 Subjective:  HPI: Austin Mcbride is a 63 y.o. 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 He reports he may have forgotten his Amlodipine  this AM, he is unsure. His BP at his last OV was 140/90 and no additional medications were added. He does not monitor his BP at home. He denies nervousness or anxiety, no recent illness. Denies chest pain, palpitations, lightheadedness, dizziness, recurrent headaches, vision changes, swelling of extremities, dyspnea with exertion or shortness of breath.  Review of Systems  All other systems reviewed and are negative.   Relevant past medical history reviewed and updated as indicated.   Past Medical History:  Diagnosis Date   COPD (chronic obstructive pulmonary disease) (HCC)    GERD (gastroesophageal reflux disease)    Gout    Hyperlipidemia    Hypertension      Past Surgical History:  Procedure Laterality Date   TONSILLECTOMY     UPPER GASTROINTESTINAL ENDOSCOPY      Allergies and medications reviewed and updated.   Current Outpatient Medications:    allopurinol  (ZYLOPRIM ) 100 MG tablet, TAKE 2 TABLETS(200 MG) BY MOUTH DAILY, Disp: 180 tablet, Rfl: 0   amLODipine  (NORVASC ) 10 MG tablet, TAKE 1 TABLET(10 MG) BY MOUTH DAILY, Disp: 90 tablet, Rfl: 0   budesonide  (PULMICORT  FLEXHALER) 180 MCG/ACT inhaler, Inhale 2 puffs into the lungs 2 (two) times daily., Disp: 3 each, Rfl: 11   omeprazole  (PRILOSEC) 40 MG capsule, TAKE 1 CAPSULE(40 MG) BY MOUTH DAILY, Disp: 90 capsule, Rfl: 0   simvastatin  (ZOCOR ) 40 MG tablet, TAKE 1 TABLET BY MOUTH DAILY AT 6 PM, Disp: 90 tablet, Rfl: 0  Allergies  Allergen Reactions   Lisinopril  Swelling    Objective:   BP (!) 140/90   Pulse 74   Temp 97.6 F (36.4 C) (Oral)   Ht 5' 6 (1.676 m)   Wt 194 lb (88 kg)   SpO2 96%   BMI 31.31 kg/m      09/01/2023    3:56 PM 09/01/2023     3:53 PM 07/01/2023    8:09 AM  Vitals with BMI  Height  5' 6 5' 6  Weight  194 lbs 183 lbs 8 oz  BMI  31.33 29.63  Systolic 140 148 859  Diastolic 90 90 80  Pulse  74 60     Physical Exam Vitals and nursing note reviewed.  Constitutional:      Appearance: Normal appearance. He is normal weight.  HENT:     Head: Normocephalic and atraumatic.  Cardiovascular:     Rate and Rhythm: Normal rate and regular rhythm.     Pulses: Normal pulses.     Heart sounds: Normal heart sounds.  Pulmonary:     Effort: Pulmonary effort is normal.     Breath sounds: Normal breath sounds.  Skin:    General: Skin is warm and dry.     Capillary Refill: Capillary refill takes less than 2 seconds.  Neurological:     General: No focal deficit present.     Mental Status: He is alert and oriented to person, place, and time. Mental status is at baseline.  Psychiatric:        Mood and Affect: Mood normal.        Behavior: Behavior normal.        Thought Content: Thought content normal.  Judgment: Judgment normal.     Assessment & Plan:  Benign essential HTN Assessment & Plan: Chronic. Patient would not like additional medicaiton at this time and I agree considering he is unsure if he took his Amlodipine  this AM. Counseled on importance of medication compliance. He will return to office for BP check in 2 days and if remains his will start Hydrochlorothiazide 12.5mg  daily. Recommend heart healthy diet such as Mediterranean diet with whole grains, fruits, vegetable, fish, lean meats, nuts, and olive oil. Limit salt. Encouraged moderate walking, 3-5 times/week for 30-50 minutes each session. Aim for at least 150 minutes.week. Goal should be pace of 3 miles/hours, or walking 1.5 miles in 30 minutes. Avoid tobacco products. Avoid excess alcohol. Take medications as prescribed and bring medications and blood pressure log with cuff to each office visit. Seek medical care for chest pain, palpitations,  shortness of breath with exertion, dizziness/lightheadedness, vision changes, recurrent headaches, or swelling of extremities.       Follow up plan: Return in about 4 weeks (around 09/29/2023) for hypertension with PCP.  Jeoffrey GORMAN Barrio, FNP

## 2023-09-03 ENCOUNTER — Encounter: Payer: Self-pay | Admitting: Family Medicine

## 2023-09-03 ENCOUNTER — Ambulatory Visit: Payer: BC Managed Care – PPO

## 2023-09-03 ENCOUNTER — Encounter (INDEPENDENT_AMBULATORY_CARE_PROVIDER_SITE_OTHER): Payer: BC Managed Care – PPO | Admitting: Family Medicine

## 2023-09-04 NOTE — Progress Notes (Signed)
 Erroneous. Nurse visit BP check

## 2023-10-02 ENCOUNTER — Other Ambulatory Visit: Payer: Self-pay | Admitting: Family Medicine

## 2023-10-03 ENCOUNTER — Other Ambulatory Visit: Payer: Self-pay | Admitting: Family Medicine

## 2023-10-08 ENCOUNTER — Other Ambulatory Visit: Payer: Self-pay

## 2023-10-08 ENCOUNTER — Telehealth: Payer: Self-pay

## 2023-10-08 ENCOUNTER — Other Ambulatory Visit: Payer: Self-pay | Admitting: Family Medicine

## 2023-10-08 DIAGNOSIS — E7800 Pure hypercholesterolemia, unspecified: Secondary | ICD-10-CM

## 2023-10-08 DIAGNOSIS — R1013 Epigastric pain: Secondary | ICD-10-CM

## 2023-10-08 MED ORDER — OMEPRAZOLE 40 MG PO CPDR: DELAYED_RELEASE_CAPSULE | ORAL | 1 refills | Status: DC

## 2023-10-08 MED ORDER — SIMVASTATIN 40 MG PO TABS: 40.0000 mg | ORAL_TABLET | Freq: Every day | ORAL | 1 refills | Status: DC

## 2023-10-08 NOTE — Telephone Encounter (Signed)
Copied from CRM [id number]. Topic: Clinical - Prescription Issue >> Oct 08, 2023  2:03 PM Geroge Baseman wrote: Reason for CRM: patient went to pick up refills of simvastatin (ZOCOR) 40 MG tablet, omeprazole (PRILOSEC) 40 MG capsule, and amLODipine (NORVASC) 10 MG tablet. He stated the pharmacy only had the 90 day refill ready for the amlodipine. He said they only had 30 day supply ready of the simvastatin, usually he gets 90 days, is going to need more refills. He said they did not have a prescription for him for the omeprazole, and he is wanting to make sure a 90 day prescription gets sent over for that. The patient would like a call back today if possible to note this has been received, [phone]

## 2023-10-08 NOTE — Telephone Encounter (Signed)
Last OV 07/01/23 Requested Prescriptions  Pending Prescriptions Disp Refills   omeprazole (PRILOSEC) 40 MG capsule [Pharmacy Med Name: OMEPRAZOLE 40MG  CAPSULES] 60 capsule     Sig: TAKE 1 CAPSULE(40 MG) BY MOUTH DAILY     Gastroenterology: Proton Pump Inhibitors Failed - 10/08/2023 11:05 AM      Failed - Valid encounter within last 12 months    Recent Outpatient Visits           1 year ago Moderate persistent asthma with exacerbation   [facility] Donita Brooks, MD   2 years ago Prostate cancer screening   [facility] Donita Brooks, MD   3 years ago Benign essential HTN   [facility] Tanya Nones, Priscille Heidelberg, MD   5 years ago Essential hypertension   [facility] Tanya Nones, Priscille Heidelberg, MD   7 years ago Essential hypertension   [facility] Pickard, Priscille Heidelberg, MD

## 2023-12-29 ENCOUNTER — Encounter (INDEPENDENT_AMBULATORY_CARE_PROVIDER_SITE_OTHER): Payer: Self-pay | Admitting: *Deleted

## 2024-01-01 ENCOUNTER — Other Ambulatory Visit: Payer: Self-pay | Admitting: Family Medicine

## 2024-01-10 ENCOUNTER — Other Ambulatory Visit: Payer: Self-pay | Admitting: Family Medicine

## 2024-01-10 DIAGNOSIS — E7800 Pure hypercholesterolemia, unspecified: Secondary | ICD-10-CM

## 2024-01-12 ENCOUNTER — Telehealth: Payer: Self-pay | Admitting: Family Medicine

## 2024-01-12 NOTE — Telephone Encounter (Unsigned)
 Copied from CRM [id number]. Topic: Clinical - Medication Refill >> Jan 12, 2024  3:51 PM Fonda T wrote: Medication:   allopurinol  (ZYLOPRIM ) 100 MG tablet budesonide  (PULMICORT  FLEXHALER) 180 MCG/ACT inhaler omeprazole  (PRILOSEC) 40 MG capsule simvastatin  (ZOCOR ) 40 MG tablet  Patient requesting 90 day supply for medications   Has the patient contacted their pharmacy? Yes (Agent: If no, request that the patient contact the pharmacy for the refill. If patient does not wish to contact the pharmacy document the reason why and proceed with request.) (Agent: If yes, when and what did the pharmacy advise?)  This is the patient's preferred pharmacy:   [id number] - [place], [place] - [address] [place] [address] Phone: [phone] Fax: [phone]  Is this the correct pharmacy for this prescription? Yes If no, delete pharmacy and type the correct one.   Has the prescription been filled recently? Yes  Is the patient out of the medication? Yes Simvastatin   Has the patient been seen for an appointment in the last year OR does the patient have an upcoming appointment? Yes  Can we respond through MyChart? Yes  Agent: Please be advised that Rx refills may take up to 3 business days. We ask that you follow-up with your pharmacy.

## 2024-01-13 NOTE — Telephone Encounter (Signed)
 OV 09/01/23 Requested Prescriptions  Pending Prescriptions Disp Refills   simvastatin  (ZOCOR ) 40 MG tablet [Pharmacy Med Name: SIMVASTATIN  40MG  TABLETS] 90 tablet     Sig: TAKE 1 TABLET BY MOUTH DAILY AT 6 PM     Cardiovascular:  Antilipid - Statins Failed - 01/13/2024 11:56 AM      Failed - Lipid Panel in normal range within the last 12 months    Cholesterol  Date Value Ref Range Status  07/01/2023 179 <200 mg/dL Final   LDL Cholesterol (Calc)  Date Value Ref Range Status  07/01/2023 90 mg/dL (calc) Final    Comment:    Reference range: <100 . Desirable range <100 mg/dL for primary prevention;   <70 mg/dL for patients with CHD or diabetic patients  with > or = 2 CHD risk factors. Aaron Aas LDL-C is now calculated using the Martin-Hopkins  calculation, which is a validated novel method providing  better accuracy than the Friedewald equation in the  estimation of LDL-C.  Melinda Sprawls et al. Erroll Heard. 9604;540(98): 2061-2068  (http://education.[facility].com/faq/FAQ164)    HDL  Date Value Ref Range Status  07/01/2023 68 > OR = 40 mg/dL Final   Triglycerides  Date Value Ref Range Status  07/01/2023 114 <150 mg/dL Final         Passed - Patient is not pregnant      Passed - Valid encounter within last 12 months    Recent Outpatient Visits           4 months ago Benign essential HTN   [place] [facility] Jenelle Mis, FNP   6 months ago Benign essential HTN   [facility] Pickard, Cisco Crest, MD   1 year ago Benign essential HTN   [place] [facility] Pickard, Cisco Crest, MD               allopurinol  (ZYLOPRIM ) 100 MG tablet [Pharmacy Med Name: ALLOPURINOL  100MG  TABLETS] 180 tablet 0    Sig: TAKE 2 TABLETS(200 MG) BY MOUTH DAILY     Endocrinology:  Gout Agents - allopurinol  Failed - 01/13/2024 11:56 AM      Failed - Uric Acid in normal range and within 360 days    Uric Acid, Serum  Date Value Ref  Range Status  03/04/2016 6.2 4.0 - 8.0 mg/dL Final    Comment:    Therapeutic target for gout patients: <6.0 mg/dL         Passed - Cr in normal range and within 360 days    Creat  Date Value Ref Range Status  07/01/2023 0.77 0.70 - 1.35 mg/dL Final         Passed - Valid encounter within last 12 months    Recent Outpatient Visits           4 months ago Benign essential HTN   [place] [facility] Jenelle Mis, FNP   6 months ago Benign essential HTN   [place] [facility] Pickard, Cisco Crest, MD   1 year ago Benign essential HTN   [place] [facility] Pickard, Cisco Crest, MD              Passed - CBC within normal limits and completed in the last 12 months    WBC  Date Value Ref Range Status  07/01/2023 5.4 3.8 - 10.8 Thousand/uL Final   RBC  Date Value Ref Range Status  07/01/2023 4.93 4.20 - 5.80 Million/uL Final   Hemoglobin  Date Value Ref Range Status  07/01/2023 14.6 13.2 - 17.1 g/dL Final   HCT  Date Value Ref Range Status  07/01/2023 44.2 38.5 - 50.0 % Final   MCHC  Date Value Ref Range Status  07/01/2023 33.0 32.0 - 36.0 g/dL Final    Comment:    For adults, a slight decrease in the calculated MCHC value (in the range of 30 to 32 g/dL) is most likely not clinically significant; however, it should be interpreted with caution in correlation with other red cell parameters and the patient's clinical condition.    [facility]  Date Value Ref Range Status  07/01/2023 29.6 27.0 - 33.0 pg Final   MCV  Date Value Ref Range Status  07/01/2023 89.7 80.0 - 100.0 fL Final   No results found for: "PLTCOUNTKUC", "LABPLAT", "POCPLA" RDW  Date Value Ref Range Status  07/01/2023 12.4 11.0 - 15.0 % Final

## 2024-01-14 ENCOUNTER — Other Ambulatory Visit: Payer: Self-pay | Admitting: Family Medicine

## 2024-04-09 ENCOUNTER — Other Ambulatory Visit: Payer: Self-pay | Admitting: Family Medicine

## 2024-04-09 NOTE — Telephone Encounter (Unsigned)
 Copied from CRM #[id number]. Topic: Clinical - Medication Refill >> Apr 09, 2024  2:42 PM Jayma L wrote: Medication: amLODipine  (NORVASC ) 10 MG tablet  Has the patient contacted their pharmacy? Yes (Agent: If no, request that the patient contact the pharmacy for the refill. If patient does not wish to contact the pharmacy document the reason why and proceed with request.) (Agent: If yes, when and what did the pharmacy advise?)  This is the patient's preferred pharmacy:  [facility] #[id number] - [place], [place] - [address] [place] [address] Phone: [phone] Fax: [phone]  Is this the correct pharmacy for this prescription? Yes If no, delete pharmacy and type the correct one.   Has the prescription been filled recently? No  Is the patient out of the medication? No  Has the patient been seen for an appointment in the last year OR does the patient have an upcoming appointment? Yes  Can we respond through MyChart? Yes  Agent: Please be advised that Rx refills may take up to 3 business days. We ask that you follow-up with your pharmacy.

## 2024-04-13 MED ORDER — AMLODIPINE BESYLATE 10 MG PO TABS: 10.0000 mg | ORAL_TABLET | Freq: Every day | ORAL | 0 refills | Status: DC

## 2024-04-13 NOTE — Telephone Encounter (Signed)
 Called patient to schedule appt for medication refills. Patient reports he usually gets medications for 1 year. Patient last OV 09/01/23. Provider recommended return f/u visit 09/29/23. Patient reports he was understanding he could get medication x 1 year due to work schedule. Appt scheduled with PCP for med refills 04/15/24. Will give courtesy refill.

## 2024-04-13 NOTE — Telephone Encounter (Signed)
 Requested by patient. Future appt scheduled 04/15/24. Courtesy refill.  Requested Prescriptions  Pending Prescriptions Disp Refills   amLODipine  (NORVASC ) 10 MG tablet 90 tablet 0    Sig: Take 1 tablet (10 mg total) by mouth daily.     Cardiovascular: Calcium Channel Blockers 2 Failed - 04/13/2024 11:08 AM      Failed - Last BP in normal range    BP Readings from Last 1 Encounters:  09/03/23 (!) 140/82         Failed - Valid encounter within last 6 months    Recent Outpatient Visits           7 months ago Benign essential HTN   [place] [facility] Kayla Jeoffrey RAMAN, FNP   9 months ago Benign essential HTN   [place] [facility] Duanne, Butler DASEN, MD   1 year ago Benign essential HTN   [place] [facility] Duanne Butler DASEN, MD              Passed - Last Heart Rate in normal range    Pulse Readings from Last 1 Encounters:  09/03/23 76

## 2024-04-13 NOTE — Telephone Encounter (Signed)
 Duplicate request, LRF 04/13/24.  Requested Prescriptions  Pending Prescriptions Disp Refills   amLODipine  (NORVASC ) 10 MG tablet [Pharmacy Med Name: AMLODIPINE  BESYLATE 10MG TABLETS] 90 tablet 0    Sig: TAKE 1 TABLET(10 MG) BY MOUTH DAILY     Cardiovascular: Calcium Channel Blockers 2 Failed - 04/13/2024 11:12 AM      Failed - Last BP in normal range    BP Readings from Last 1 Encounters:  09/03/23 (!) 140/82         Failed - Valid encounter within last 6 months    Recent Outpatient Visits           7 months ago Benign essential HTN   [place] [facility] Kayla Jeoffrey RAMAN, FNP   9 months ago Benign essential HTN   [place] [facility] Pickard, Butler DASEN, MD   1 year ago Benign essential HTN   [place] [facility] Duanne Butler DASEN, MD              Passed - Last Heart Rate in normal range    Pulse Readings from Last 1 Encounters:  09/03/23 76

## 2024-04-15 ENCOUNTER — Ambulatory Visit: Admitting: Family Medicine

## 2024-04-15 ENCOUNTER — Encounter: Payer: Self-pay | Admitting: Family Medicine

## 2024-04-15 VITALS — BP 132/72 | HR 70 | Temp 97.7°F | Ht 66.0 in | Wt 189.0 lb

## 2024-04-15 DIAGNOSIS — I10 Essential (primary) hypertension: Secondary | ICD-10-CM | POA: Diagnosis not present

## 2024-04-15 MED ORDER — AMLODIPINE BESYLATE 10 MG PO TABS: 10.0000 mg | ORAL_TABLET | Freq: Every day | ORAL | 3 refills | Status: AC

## 2024-04-15 NOTE — Progress Notes (Signed)
 Subjective:    Patient ID: Austin Mcbride, male    DOB: Dec 02, 1960, 63 y.o.   MRN: [id number]  Patient is here today to get a refill on his blood pressure medication.  He is currently on amlodipine .  His blood pressure is 132/72.  He denies any chest pain or shortness of breath.  On a CT scan from 2023, he was found to have calcification in his coronary arteries. Past Medical History:  Diagnosis Date   COPD (chronic obstructive pulmonary disease) (HCC)    GERD (gastroesophageal reflux disease)    Gout    Hyperlipidemia    Hypertension    Past Surgical History:  Procedure Laterality Date   TONSILLECTOMY     UPPER GASTROINTESTINAL ENDOSCOPY     Current Outpatient Medications on File Prior to Visit  Medication Sig Dispense Refill   allopurinol  (ZYLOPRIM ) 100 MG tablet TAKE 2 TABLETS(200 MG) BY MOUTH DAILY 180 tablet 0   budesonide  (PULMICORT  FLEXHALER) 180 MCG/ACT inhaler Inhale 2 puffs into the lungs 2 (two) times daily. 3 each 11   omeprazole  (PRILOSEC) 40 MG capsule TAKE 1 CAPSULE(40 MG) BY MOUTH DAILY 90 capsule 1   simvastatin  (ZOCOR ) 40 MG tablet TAKE 1 TABLET BY MOUTH DAILY AT 6 PM 90 tablet 1   No current facility-administered medications on file prior to visit.   Allergies  Allergen Reactions   Lisinopril  Swelling   Social History   Socioeconomic History   Marital status: Married    Spouse name: Not on file   Number of children: Not on file   Years of education: Not on file   Highest education level: 12th grade  Occupational History   Not on file  Tobacco Use   Smoking status: Former    Current packs/day: 0.00    Types: Cigarettes    Quit date: 09/03/2016    Years since quitting: 7.6   Smokeless tobacco: Never  Substance and Sexual Activity   Alcohol use: Yes    Alcohol/week: 12.0 standard drinks of alcohol    Types: 12 Cans of beer per week   Drug use: Not on file   Sexual activity: Not on file  Other Topics Concern   Not on file  Social History Narrative    Not on file   Social Drivers of Health   Financial Resource Strain: Low Risk  (09/01/2023)   Overall Financial Resource Strain (CARDIA)    Difficulty of Paying Living Expenses: Not hard at all  Food Insecurity: No Food Insecurity (09/01/2023)   Hunger Vital Sign    Worried About Running Out of Food in the Last Year: Never true    Ran Out of Food in the Last Year: Never true  Transportation Needs: No Transportation Needs (09/01/2023)   PRAPARE - Administrator, Civil Service (Medical): No    Lack of Transportation (Non-Medical): No  Physical Activity: Sufficiently Active (09/01/2023)   Exercise Vital Sign    Days of Exercise per Week: 7 days    Minutes of Exercise per Session: 60 min  Stress: No Stress Concern Present (09/01/2023)   [employer] of Occupational Health - Occupational Stress Questionnaire    Feeling of Stress : Not at all  Social Connections: Unknown (09/01/2023)   Social Connection and Isolation Panel    Frequency of Communication with Friends and Family: More than three times a week    Frequency of Social Gatherings with Friends and Family: Twice a week    Attends Religious Services:  Patient declined    Active Member of Clubs or Organizations: No    Attends Banker Meetings: Not on file    Marital Status: Married  Catering manager Violence: Not on file      Review of Systems  All other systems reviewed and are negative.      Objective:   Physical Exam Vitals reviewed.  Constitutional:      General: He is not in acute distress.    Appearance: He is well-developed. He is not diaphoretic.  HENT:     Head: Normocephalic.     Right Ear: External ear normal.     Left Ear: External ear normal.     Nose: Nose normal.     Mouth/Throat:     Pharynx: No oropharyngeal exudate.  Eyes:     General: No scleral icterus.    Conjunctiva/sclera: Conjunctivae normal.     Pupils: Pupils are equal, round, and reactive to light.  Neck:      Thyroid: No thyromegaly.     Vascular: No JVD.  Cardiovascular:     Rate and Rhythm: Normal rate and regular rhythm.     Heart sounds: Normal heart sounds. No murmur heard.    No friction rub. No gallop.  Pulmonary:     Effort: Pulmonary effort is normal. No respiratory distress.     Breath sounds: Normal breath sounds. No stridor. No wheezing or rales.  Chest:     Chest wall: No tenderness.  Abdominal:     General: Bowel sounds are normal. There is no distension.     Palpations: Abdomen is soft.     Tenderness: There is no abdominal tenderness. There is no rebound.  Musculoskeletal:     Cervical back: Normal range of motion and neck supple.  Lymphadenopathy:     Cervical: No cervical adenopathy.  Skin:    General: Skin is warm.     Coloration: Skin is not pale.     Findings: No erythema or rash.  Neurological:     Mental Status: He is alert and oriented to person, place, and time.     Cranial Nerves: No cranial nerve deficit.     Motor: No abnormal muscle tone.     Coordination: Coordination normal.           Assessment & Plan:  Benign essential HTN - Plan: amLODipine  (NORVASC ) 10 MG tablet, CT CARDIAC SCORING (SELF PAY ONLY) His blood pressure today is well-controlled.  Continue amlodipine  10 mg daily.  Come in fasting for lab work in November for his complete physical exam.  I would like to get a coronary artery calcium score to risk stratify the patient and determine the severity of his coronary artery calcifications.  This may change the intensity of his statin therapy.

## 2024-05-07 ENCOUNTER — Ambulatory Visit (HOSPITAL_COMMUNITY)
Admission: RE | Admit: 2024-05-07 | Discharge: 2024-05-07 | Disposition: A | Discharge status: Home / Self Care | Payer: Self-pay | Source: Ambulatory Visit | Attending: Family Medicine | Admitting: Family Medicine

## 2024-05-07 DIAGNOSIS — I10 Essential (primary) hypertension: Secondary | ICD-10-CM | POA: Insufficient documentation

## 2024-05-10 ENCOUNTER — Ambulatory Visit: Payer: Self-pay | Admitting: Family Medicine

## 2024-05-10 ENCOUNTER — Other Ambulatory Visit: Payer: Self-pay

## 2024-05-10 DIAGNOSIS — E7800 Pure hypercholesterolemia, unspecified: Secondary | ICD-10-CM

## 2024-05-10 MED ORDER — EZETIMIBE 10 MG PO TABS: 10.0000 mg | ORAL_TABLET | Freq: Every day | ORAL | 3 refills | Status: AC

## 2024-05-11 ENCOUNTER — Other Ambulatory Visit: Payer: Self-pay

## 2024-05-11 DIAGNOSIS — R931 Abnormal findings on diagnostic imaging of heart and coronary circulation: Secondary | ICD-10-CM

## 2024-05-11 DIAGNOSIS — E7800 Pure hypercholesterolemia, unspecified: Secondary | ICD-10-CM

## 2024-06-04 ENCOUNTER — Other Ambulatory Visit: Payer: Self-pay | Admitting: Family Medicine

## 2024-06-04 DIAGNOSIS — E7800 Pure hypercholesterolemia, unspecified: Secondary | ICD-10-CM

## 2024-07-03 ENCOUNTER — Other Ambulatory Visit: Payer: Self-pay | Admitting: Family Medicine

## 2024-07-03 DIAGNOSIS — R1013 Epigastric pain: Secondary | ICD-10-CM

## 2024-07-05 NOTE — Telephone Encounter (Signed)
 Requested Prescriptions  Pending Prescriptions Disp Refills   omeprazole  (PRILOSEC) 40 MG capsule [Pharmacy Med Name: OMEPRAZOLE  40MG  CAPSULES] 90 capsule 1    Sig: TAKE 1 CAPSULE(40 MG) BY MOUTH DAILY     Gastroenterology: Proton Pump Inhibitors Passed - 07/05/2024  1:38 PM      Passed - Valid encounter within last 12 months    Recent Outpatient Visits           2 months ago Benign essential HTN   [place] [facility] Duanne Butler DASEN, MD   10 months ago Benign essential HTN   [place] [facility] Kayla Jeoffrey RAMAN, FNP   1 year ago Benign essential HTN   [place] [facility] Duanne Butler DASEN, MD   2 years ago Benign essential HTN   [place] [facility] Pickard, Butler DASEN, MD       Future Appointments             In 1 month Branch, Dorn FALCON, MD [facility]

## 2024-07-06 ENCOUNTER — Ambulatory Visit (INDEPENDENT_AMBULATORY_CARE_PROVIDER_SITE_OTHER): Admitting: Family Medicine

## 2024-07-06 ENCOUNTER — Encounter: Payer: Self-pay | Admitting: Family Medicine

## 2024-07-06 VITALS — BP 118/74 | HR 55 | Temp 98.2°F | Ht 66.0 in | Wt 190.2 lb

## 2024-07-06 DIAGNOSIS — R931 Abnormal findings on diagnostic imaging of heart and coronary circulation: Secondary | ICD-10-CM | POA: Diagnosis not present

## 2024-07-06 DIAGNOSIS — E7800 Pure hypercholesterolemia, unspecified: Secondary | ICD-10-CM | POA: Diagnosis not present

## 2024-07-06 DIAGNOSIS — Z125 Encounter for screening for malignant neoplasm of prostate: Secondary | ICD-10-CM | POA: Diagnosis not present

## 2024-07-06 DIAGNOSIS — Z1211 Encounter for screening for malignant neoplasm of colon: Secondary | ICD-10-CM

## 2024-07-06 DIAGNOSIS — Z23 Encounter for immunization: Secondary | ICD-10-CM | POA: Diagnosis not present

## 2024-07-06 DIAGNOSIS — Z0000 Encounter for general adult medical examination without abnormal findings: Secondary | ICD-10-CM

## 2024-07-06 DIAGNOSIS — Z0001 Encounter for general adult medical examination with abnormal findings: Secondary | ICD-10-CM | POA: Diagnosis not present

## 2024-07-06 NOTE — Progress Notes (Signed)
 Subjective:    Patient ID: Austin Mcbride, male    DOB: 1961-02-19, 63 y.o.   MRN: [id number]  Patient is a 63 year old Caucasian gentleman here today for complete physical exam.  He is overdue for a colonoscopy.  He is also due for prostate cancer screening.  He continues to smoke.  He is due for a flu shot.  He has had the pneumonia shot and the shingles shot.  He is also due for COVID shot.  Earlier this year we checked a coronary artery calcium score that was elevated.  We added Zetia  to try to achieve an LDL cholesterol less than 55.  He continues to smoke but he is taking aspirin 81 mg daily.  He denies any angina or shortness of breath with activity. FINDINGS: Coronary Calcium Score:   Left main: 51   Left anterior descending artery: 305   Left circumflex artery: 478   Right coronary artery: 126   Total: 959   Percentile: 94th   Ascending Aorta: Normal caliber. Aortic atherosclerosis.   Aortic Valve Calcium score: 107   Mitral annular calcification: 0   Pericardium: Normal.   Non-cardiac: See separate report from [facility].   IMPRESSION: 1. Coronary calcium score of 959. This was 94th percentile for age-, race-, and sex-matched controls. Past Medical History:  Diagnosis Date   COPD (chronic obstructive pulmonary disease) (HCC)    GERD (gastroesophageal reflux disease)    Gout    Hyperlipidemia    Hypertension    Past Surgical History:  Procedure Laterality Date   TONSILLECTOMY     UPPER GASTROINTESTINAL ENDOSCOPY     Current Outpatient Medications on File Prior to Visit  Medication Sig Dispense Refill   allopurinol  (ZYLOPRIM ) 100 MG tablet TAKE 2 TABLETS(200 MG) BY MOUTH DAILY 180 tablet 0   amLODipine  (NORVASC ) 10 MG tablet Take 1 tablet (10 mg total) by mouth daily. 90 tablet 3   budesonide  (PULMICORT  FLEXHALER) 180 MCG/ACT inhaler Inhale 2 puffs into the lungs 2 (two) times daily. 3 each 11   ezetimibe  (ZETIA ) 10 MG tablet Take 1 tablet (10 mg  total) by mouth daily. 90 tablet 3   omeprazole  (PRILOSEC) 40 MG capsule TAKE 1 CAPSULE(40 MG) BY MOUTH DAILY 90 capsule 1   simvastatin  (ZOCOR ) 40 MG tablet TAKE 1 TABLET(40 MG) BY MOUTH DAILY 60 tablet 1   No current facility-administered medications on file prior to visit.   Allergies  Allergen Reactions   Lisinopril  Swelling   Social History   Socioeconomic History   Marital status: Married    Spouse name: Not on file   Number of children: Not on file   Years of education: Not on file   Highest education level: 12th grade  Occupational History   Not on file  Tobacco Use   Smoking status: Former    Current packs/day: 0.00    Types: Cigarettes    Quit date: 09/03/2016    Years since quitting: 7.8   Smokeless tobacco: Never  Substance and Sexual Activity   Alcohol use: Yes    Alcohol/week: 12.0 standard drinks of alcohol    Types: 12 Cans of beer per week   Drug use: Not on file   Sexual activity: Not on file  Other Topics Concern   Not on file  Social History Narrative   Not on file   Social Drivers of Health   Financial Resource Strain: Low Risk  (07/03/2024)   Overall Financial Resource Strain (CARDIA)  Difficulty of Paying Living Expenses: Not hard at all  Food Insecurity: No Food Insecurity (07/03/2024)   Hunger Vital Sign    Worried About Running Out of Food in the Last Year: Never true    Ran Out of Food in the Last Year: Never true  Transportation Needs: No Transportation Needs (07/03/2024)   PRAPARE - Administrator, Civil Service (Medical): No    Lack of Transportation (Non-Medical): No  Physical Activity: Sufficiently Active (07/03/2024)   Exercise Vital Sign    Days of Exercise per Week: 7 days    Minutes of Exercise per Session: 120 min  Stress: No Stress Concern Present (07/03/2024)   [employer] of Occupational Health - Occupational Stress Questionnaire    Feeling of Stress: Only a little  Social Connections: Moderately Isolated  (07/03/2024)   Social Connection and Isolation Panel    Frequency of Communication with Friends and Family: Twice a week    Frequency of Social Gatherings with Friends and Family: Once a week    Attends Religious Services: Never    Database Administrator or Organizations: No    Attends Engineer, Structural: Not on file    Marital Status: Married  Catering Manager Violence: Not on file      Review of Systems  All other systems reviewed and are negative.      Objective:   Physical Exam Vitals reviewed.  Constitutional:      General: He is not in acute distress.    Appearance: He is well-developed. He is not diaphoretic.  HENT:     Head: Normocephalic.     Right Ear: External ear normal.     Left Ear: External ear normal.     Nose: Nose normal.     Mouth/Throat:     Pharynx: No oropharyngeal exudate.  Eyes:     General: No scleral icterus.    Conjunctiva/sclera: Conjunctivae normal.     Pupils: Pupils are equal, round, and reactive to light.  Neck:     Thyroid: No thyromegaly.     Vascular: No JVD.  Cardiovascular:     Rate and Rhythm: Normal rate and regular rhythm.     Heart sounds: Normal heart sounds. No murmur heard.    No friction rub. No gallop.  Pulmonary:     Effort: Pulmonary effort is normal. No respiratory distress.     Breath sounds: Normal breath sounds. No stridor. No wheezing or rales.  Chest:     Chest wall: No tenderness.  Abdominal:     General: Bowel sounds are normal. There is no distension.     Palpations: Abdomen is soft.     Tenderness: There is no abdominal tenderness. There is no rebound.  Musculoskeletal:     Cervical back: Normal range of motion and neck supple.  Lymphadenopathy:     Cervical: No cervical adenopathy.  Skin:    General: Skin is warm.     Coloration: Skin is not pale.     Findings: No erythema or rash.  Neurological:     Mental Status: He is alert and oriented to person, place, and time.     Cranial Nerves:  No cranial nerve deficit.     Motor: No abnormal muscle tone.     Coordination: Coordination normal.           Assessment & Plan:  Prostate cancer screening - Plan: PSA  Colon cancer screening - Plan: Ambulatory referral to Gastroenterology  Pure  hypercholesterolemia - Plan: CBC with Differential/Platelet, Comprehensive metabolic panel with GFR, Lipid panel  Abnormal screening cardiac CT  General medical exam I do not believe the patient has any flow-limiting lesions but he does have coronary artery disease.  Recommended aspirin 81 mg daily.  Recommended smoking cessation.  Recommended trying to achieve an LDL cholesterol less than 55.  Will recheck that today.  If well above 55, would replace Zetia  with Repatha.  Consult GI for colonoscopy.  Screen for prostate cancer with PSA.  Blood pressure is outstanding at 118/74.  Patient received his flu shot.  Recommended the COVID-vaccine.  Shingles and pneumonia are up-to-date.

## 2024-07-06 NOTE — Addendum Note (Signed)
 Addended by: ANGELENA RONAL BRADLEY K on: 07/06/2024 08:45 AM   Modules accepted: Orders

## 2024-07-07 LAB — COMPREHENSIVE METABOLIC PANEL WITH GFR
AG Ratio: 2 (calc) (ref 1.0–2.5)
ALT: 22 U/L (ref 9–46)
AST: 17 U/L (ref 10–35)
Albumin: 5 g/dL (ref 3.6–5.1)
Alkaline phosphatase (APISO): 52 U/L (ref 35–144)
BUN: 12 mg/dL (ref 7–25)
CO2: 28 mmol/L (ref 20–32)
Calcium: 9.7 mg/dL (ref 8.6–10.3)
Chloride: 103 mmol/L (ref 98–110)
Creat: 0.83 mg/dL (ref 0.70–1.35)
Globulin: 2.5 g/dL (ref 1.9–3.7)
Glucose, Bld: 89 mg/dL (ref 65–99)
Potassium: 4.2 mmol/L (ref 3.5–5.3)
Sodium: 140 mmol/L (ref 135–146)
Total Bilirubin: 1.1 mg/dL (ref 0.2–1.2)
Total Protein: 7.5 g/dL (ref 6.1–8.1)
eGFR: 98 mL/min/1.73m2 (ref >=60)

## 2024-07-07 LAB — CBC WITH DIFFERENTIAL/PLATELET
Absolute Lymphocytes: 1629 {cells}/uL (ref 850–3900)
Absolute Monocytes: 464 {cells}/uL (ref 200–950)
Basophils Absolute: 43 {cells}/uL (ref 0–200)
Basophils Relative: 0.7 %
Eosinophils Absolute: 275 {cells}/uL (ref 15–500)
Eosinophils Relative: 4.5 %
HCT: 43.7 % (ref 38.5–50.0)
Hemoglobin: 14.5 g/dL (ref 13.2–17.1)
MCH: 30.3 pg (ref 27.0–33.0)
MCHC: 33.2 g/dL (ref 32.0–36.0)
MCV: 91.4 fL (ref 80.0–100.0)
MPV: 10.3 fL (ref 7.5–12.5)
Monocytes Relative: 7.6 %
Neutro Abs: 3691 {cells}/uL (ref 1500–7800)
Neutrophils Relative %: 60.5 %
Platelets: 267 Thousand/uL (ref 140–400)
RBC: 4.78 Million/uL (ref 4.20–5.80)
RDW: 12.6 % (ref 11.0–15.0)
Total Lymphocyte: 26.7 %
WBC: 6.1 Thousand/uL (ref 3.8–10.8)

## 2024-07-07 LAB — LIPID PANEL
Cholesterol: 173 mg/dL (ref <200)
HDL: 66 mg/dL (ref >=40)
LDL Cholesterol (Calc): 82 mg/dL
Non-HDL Cholesterol (Calc): 107 mg/dL (ref <130)
Total CHOL/HDL Ratio: 2.6 (calc) (ref <5.0)
Triglycerides: 145 mg/dL (ref <150)

## 2024-07-07 LAB — PSA: PSA: 0.21 ng/mL (ref <=4.00)

## 2024-07-08 ENCOUNTER — Encounter (INDEPENDENT_AMBULATORY_CARE_PROVIDER_SITE_OTHER): Payer: Self-pay | Admitting: *Deleted

## 2024-07-08 ENCOUNTER — Ambulatory Visit: Payer: Self-pay | Admitting: Family Medicine

## 2024-07-08 DIAGNOSIS — I10 Essential (primary) hypertension: Secondary | ICD-10-CM

## 2024-07-08 DIAGNOSIS — E7800 Pure hypercholesterolemia, unspecified: Secondary | ICD-10-CM

## 2024-07-09 ENCOUNTER — Other Ambulatory Visit: Payer: Self-pay

## 2024-07-09 ENCOUNTER — Telehealth: Payer: Self-pay | Admitting: Family Medicine

## 2024-07-09 MED ORDER — ROSUVASTATIN CALCIUM 40 MG PO TABS: 40.0000 mg | ORAL_TABLET | Freq: Every day | ORAL | 3 refills | Status: AC

## 2024-07-09 NOTE — Progress Notes (Signed)
 DX zocor  40 mg and ordering crestor 40 mg per provider requested change in therapy . Notation is in lab results 07/06/2024. SRP.

## 2024-07-09 NOTE — Telephone Encounter (Signed)
 Copied from CRM #[id number]. Topic: Clinical - Medication Question >> Jul 09, 2024  1:03 PM Winona R wrote: Pt would like to know if he should also stop taking the ezetimibe  (ZETIA ) 10 MG tablet or should he take it with the new med rosuvastatin (CRESTOR) 40 MG tablet [[id number]]

## 2024-08-04 ENCOUNTER — Other Ambulatory Visit: Payer: Self-pay | Admitting: Family Medicine

## 2024-08-31 ENCOUNTER — Other Ambulatory Visit: Payer: Self-pay | Admitting: Family Medicine

## 2024-09-01 ENCOUNTER — Ambulatory Visit: Admitting: Cardiology

## 2024-09-01 ENCOUNTER — Encounter: Payer: Self-pay | Admitting: Cardiology

## 2024-09-01 VITALS — BP 124/80 | HR 76 | Ht 66.0 in | Wt 189.8 lb

## 2024-09-01 DIAGNOSIS — I10 Essential (primary) hypertension: Secondary | ICD-10-CM

## 2024-09-01 DIAGNOSIS — I2510 Atherosclerotic heart disease of native coronary artery without angina pectoris: Secondary | ICD-10-CM | POA: Diagnosis not present

## 2024-09-01 NOTE — Progress Notes (Signed)
 "     Clinical Summary Austin Mcbride is a 64 y.o.male seen as a new consult, referred by Dr Duanne for the following medical problems.   1.Coronary atherosclerosis - 04/2024 coronary calcium : 959, 94th percentile - no chest pain, no SOB/DOE.  - walks regularly with his dogs 1 to 1.5 hrs.  - he is on daily ASA 81mg  daily.  CAD risk factors: HTN, HLD, + smoker x 35 years, father MI age 5 passed away    2.HTN - compliant with meds  3.HLD 06/2024 TC 173 HDL 66 TG 145 LDL 82 -based on this panel pcp chaged simva to crestor  40mg  daily.  - reports started zetia  in September.   SH: drives truck locally, delivers mulch   Past Medical History:  Diagnosis Date   COPD (chronic obstructive pulmonary disease) (HCC)    GERD (gastroesophageal reflux disease)    Gout    Hyperlipidemia    Hypertension      Allergies[1]   Current Outpatient Medications  Medication Sig Dispense Refill   allopurinol  (ZYLOPRIM ) 100 MG tablet TAKE 2 TABLETS(200 MG) BY MOUTH DAILY 180 tablet 0   amLODipine  (NORVASC ) 10 MG tablet Take 1 tablet (10 mg total) by mouth daily. 90 tablet 3   budesonide  (PULMICORT  FLEXHALER) 180 MCG/ACT inhaler Inhale 2 puffs into the lungs 2 (two) times daily. 3 each 11   ezetimibe  (ZETIA ) 10 MG tablet Take 1 tablet (10 mg total) by mouth daily. 90 tablet 3   omeprazole  (PRILOSEC) 40 MG capsule TAKE 1 CAPSULE(40 MG) BY MOUTH DAILY 90 capsule 1   rosuvastatin  (CRESTOR ) 40 MG tablet Take 1 tablet (40 mg total) by mouth daily. 90 tablet 3   No current facility-administered medications for this visit.     Past Surgical History:  Procedure Laterality Date   TONSILLECTOMY     UPPER GASTROINTESTINAL ENDOSCOPY       Allergies[2]    Family History  Problem Relation Age of Onset   Heart disease Father    Breast cancer Paternal Grandmother    Lung cancer Paternal Grandfather      Social History Mr. Pressley reports that he quit smoking about 8 years ago. His smoking use  included cigarettes. He has never used smokeless tobacco. Mr. Salvia reports current alcohol use of about 12.0 standard drinks of alcohol per week.    Physical Examination Today's Vitals   09/01/24 1344  BP: 124/80  Pulse: 76  SpO2: 96%  Weight: 189 lb 12.8 oz (86.1 kg)  Height: 5' 6 (1.676 m)   Body mass index is 30.63 kg/m.  Gen: resting comfortably, no acute distress HEENT: no scleral icterus, pupils equal round and reactive, no palptable cervical adenopathy,  CV: RRR, no m/rg, no jvd Resp: Clear to auscultation bilaterally GI: abdomen is soft, non-tender, non-distended, normal bowel sounds, no hepatosplenomegaly MSK: extremities are warm, no edema.  Skin: warm, no rash Neuro:  no focal deficits Psych: appropriate affect    Assessment and Plan  1.Coronary atherosclerosis - elevated coronary calcium  score - no symptoms, in absence of symptoms focus on risk factor modification and medical therapy - continue ASA, statin. Goal LDL <70. - EKG today SR, no ischemic changes   2. HLD - recent changed in statin from simvastatin  to crestor , he remains on zetia  10mg  daily - goal LDL <70 - upcoming labs with pcp, continue current therapy  3. HTN -at goal, continue current meds   F/u 1 year      Dorn PHEBE Ross,  M.D.     [1]  Allergies Allergen Reactions   Lisinopril  Swelling  [2]  Allergies Allergen Reactions   Lisinopril  Swelling   "

## 2024-09-01 NOTE — Patient Instructions (Signed)
 Medication Instructions:  Your physician recommends that you continue on your current medications as directed. Please refer to the Current Medication list given to you today.  *If you need a refill on your cardiac medications before your next appointment, please call your pharmacy*  Lab Work: None If you have labs (blood work) drawn today and your tests are completely normal, you will receive your results only by: MyChart Message (if you have MyChart) OR A paper copy in the mail If you have any lab test that is abnormal or we need to change your treatment, we will call you to review the results.  Testing/Procedures: None  Follow-Up: At [facility], you and your health needs are our priority.  As part of our continuing mission to provide you with exceptional heart care, our providers are all part of one team.  This team includes your primary Cardiologist (physician) and Advanced Practice Providers or APPs (Physician Assistants and Nurse Practitioners) who all work together to provide you with the care you need, when you need it.  Your next appointment:   1 year(s)  Provider:   You may see Alvan Carrier, MD or one of the following Advanced Practice Providers on your designated Care Team:   Laymon Qua, PA-C  [place], [place] Olivia [place], [place]     We recommend signing up for the patient portal called MyChart.  Sign up information is provided on this After Visit Summary.  MyChart is used to connect with patients for Virtual Visits (Telemedicine).  Patients are able to view lab/test results, encounter notes, upcoming appointments, etc.  Non-urgent messages can be sent to your provider as well.   To learn more about what you can do with MyChart, go to [email or web address].   Other Instructions Thank you for choosing [facility]!

## 2024-10-01 ENCOUNTER — Other Ambulatory Visit: Payer: Self-pay | Admitting: Family Medicine

## 2025-01-03 ENCOUNTER — Ambulatory Visit: Admitting: Family Medicine

## 2025-07-07 ENCOUNTER — Encounter: Admitting: Family Medicine
